# Patient Record
Sex: Male | Born: 1960 | Race: White | Hispanic: No | Marital: Married | Smoking: Never smoker
Health system: Southern US, Community
[De-identification: ages and names within clinical notes are randomized; demographics above are authoritative.]

## PROBLEM LIST (undated history)

## (undated) DIAGNOSIS — R319 Hematuria, unspecified: Secondary | ICD-10-CM

## (undated) DIAGNOSIS — E119 Type 2 diabetes mellitus without complications: Secondary | ICD-10-CM

## (undated) DIAGNOSIS — F39 Unspecified mood [affective] disorder: Secondary | ICD-10-CM

## (undated) HISTORY — DX: Hematuria, unspecified: R31.9

## (undated) HISTORY — PX: HERNIA REPAIR: SHX51

## (undated) HISTORY — DX: Unspecified mood (affective) disorder: F39

## (undated) HISTORY — PX: TONSILLECTOMY: SUR1361

## (undated) HISTORY — PX: FEMUR FRACTURE SURGERY: SHX633

---

## 1993-04-14 HISTORY — PX: LUMBAR DISC SURGERY: SHX700

## 2012-06-03 ENCOUNTER — Ambulatory Visit: Payer: Self-pay | Admitting: Emergency Medicine

## 2012-06-03 LAB — DOT URINE DIP
Blood: NEGATIVE
Specific Gravity: 1.02 (ref 1.003–1.030)

## 2012-10-29 LAB — LIPID PANEL
Cholesterol: 159 mg/dL (ref 0–200)
HDL: 31 mg/dL — AB (ref 35–70)
LDL Cholesterol: 106 mg/dL
TRIGLYCERIDES: 108 mg/dL (ref 40–160)

## 2012-10-29 LAB — BASIC METABOLIC PANEL
BUN: 13 mg/dL (ref 4–21)
CREATININE: 1.1 mg/dL (ref <=1.3)

## 2012-10-29 LAB — PSA: PSA: 0.9

## 2013-06-08 ENCOUNTER — Ambulatory Visit: Payer: Self-pay | Admitting: Family Medicine

## 2013-06-08 LAB — DOT URINE DIP
Glucose,UR: NEGATIVE mg/dL (ref 0–75)
Protein: NEGATIVE
Specific Gravity: 1.025 (ref 1.003–1.030)

## 2014-06-22 ENCOUNTER — Ambulatory Visit: Payer: Self-pay | Admitting: Emergency Medicine

## 2014-08-03 LAB — BASIC METABOLIC PANEL: GLUCOSE: 183 mg/dL

## 2014-10-30 ENCOUNTER — Encounter: Payer: Self-pay | Admitting: Internal Medicine

## 2014-10-30 ENCOUNTER — Other Ambulatory Visit: Payer: Self-pay | Admitting: Internal Medicine

## 2014-10-30 DIAGNOSIS — E785 Hyperlipidemia, unspecified: Secondary | ICD-10-CM | POA: Insufficient documentation

## 2014-10-30 DIAGNOSIS — I10 Essential (primary) hypertension: Secondary | ICD-10-CM | POA: Insufficient documentation

## 2014-10-30 DIAGNOSIS — E119 Type 2 diabetes mellitus without complications: Secondary | ICD-10-CM

## 2014-10-30 DIAGNOSIS — R7303 Prediabetes: Secondary | ICD-10-CM | POA: Insufficient documentation

## 2014-10-30 HISTORY — DX: Essential (primary) hypertension: I10

## 2014-12-08 ENCOUNTER — Ambulatory Visit: Payer: Self-pay | Admitting: Internal Medicine

## 2014-12-20 ENCOUNTER — Ambulatory Visit: Payer: Self-pay | Admitting: Internal Medicine

## 2014-12-26 ENCOUNTER — Encounter: Payer: Self-pay | Admitting: Internal Medicine

## 2014-12-26 ENCOUNTER — Ambulatory Visit (INDEPENDENT_AMBULATORY_CARE_PROVIDER_SITE_OTHER): Payer: Self-pay | Admitting: Internal Medicine

## 2014-12-26 VITALS — BP 100/60 | HR 84 | Ht 74.0 in | Wt 232.6 lb

## 2014-12-26 DIAGNOSIS — E785 Hyperlipidemia, unspecified: Secondary | ICD-10-CM

## 2014-12-26 DIAGNOSIS — I10 Essential (primary) hypertension: Secondary | ICD-10-CM

## 2014-12-26 DIAGNOSIS — E119 Type 2 diabetes mellitus without complications: Secondary | ICD-10-CM

## 2014-12-26 NOTE — Progress Notes (Signed)
Date:  12/26/2014   Name:  Daryl Cochran   DOB:  1960/09/10   MRN:  [id number]   Chief Complaint: Diabetes and Hypertension Diabetes He presents for his follow-up diabetic visit. He has type 2 diabetes mellitus. The initial diagnosis of diabetes was made 4 months ago. His disease course has been improving. There are no hypoglycemic associated symptoms. Pertinent negatives for diabetes include no chest pain, no polyphagia and no polyuria. Symptoms are stable. His weight is stable. He is following a diabetic and generally healthy diet. He participates in exercise daily. His breakfast blood glucose is taken between 6-7 am. His breakfast blood glucose range is generally 110-130 mg/dl.  Hypertension This is a chronic problem. The current episode started more than 1 year ago. The problem is unchanged. The problem is controlled. Pertinent negatives include no chest pain, palpitations or shortness of breath. The current treatment provides significant improvement. There are no compliance problems.      Review of Systems:  Review of Systems  Constitutional: Negative for fever and unexpected weight change (has lost about 15 pounds since his last visit).  Respiratory: Negative for cough, choking and shortness of breath.   Cardiovascular: Negative for chest pain and palpitations.  Gastrointestinal: Negative for abdominal pain and diarrhea.  Endocrine: Negative for polyphagia and polyuria.  Genitourinary: Negative for dysuria and frequency.    Patient Active Problem List   Diagnosis Date Noted  . Dyslipidemia 10/30/2014  . Essential (primary) hypertension 10/30/2014  . Diabetes mellitus, type 2 10/30/2014    Prior to Admission medications   Medication Sig Start Date End Date Taking? Authorizing Provider  lisinopril (PRINIVIL,ZESTRIL) 10 MG tablet Take 1 tablet by mouth daily. 05/30/14  Yes Historical Provider, MD  metFORMIN (GLUMETZA) 500 MG (MOD) 24 hr tablet Take 2 tablets by mouth daily.  08/03/14  Yes Historical Provider, MD    No Known Allergies  Past Surgical History  Procedure Laterality Date  . Tonsillectomy    . Lumbar disc surgery  1995    Social History  Substance Use Topics  . Smoking status: Never Smoker   . Smokeless tobacco: None  . Alcohol Use: 2.4 oz/week    4 Standard drinks or equivalent per week     Medication list has been reviewed and updated.  Physical Examination:  Physical Exam  Constitutional: He is oriented to person, place, and time. He appears well-developed and well-nourished.  Neck: Normal range of motion. Neck supple. No thyromegaly present.  Cardiovascular: Normal rate, regular rhythm and normal heart sounds.   Pulmonary/Chest: Effort normal and breath sounds normal. No respiratory distress.  Musculoskeletal: He exhibits no edema.  Neurological: He is alert and oriented to person, place, and time.  Psychiatric: He has a normal mood and affect.  Nursing note and vitals reviewed.   BP 100/60 mmHg  Pulse 84  Ht 6\' 2"  (1.88 m)  Wt 232 lb 9.6 oz (105.507 kg)  BMI 29.85 kg/m2  Assessment and Plan: 1. Essential (primary) hypertension Control continue ACE inhibitor - Basic metabolic panel  2. Type 2 diabetes mellitus without complication [place]'s diet and weight loss; continue metformin - Hemoglobin A1c  3. Dyslipidemia Last lipids were within normal range Continue low-fat diet and will check at follow-up visit   Halina Maidens, MD [facility]  12/26/2014

## 2014-12-27 LAB — BASIC METABOLIC PANEL
BUN/Creatinine Ratio: 22 — ABNORMAL HIGH (ref 9–20)
BUN: 26 mg/dL — AB (ref 6–24)
CALCIUM: 9.8 mg/dL (ref 8.7–10.2)
CO2: 23 mmol/L (ref 18–29)
Chloride: 99 mmol/L (ref 97–108)
Creatinine, Ser: 1.19 mg/dL (ref 0.76–1.27)
GFR calc Af Amer: 80 mL/min/{1.73_m2} (ref >59)
GFR calc non Af Amer: 69 mL/min/{1.73_m2} (ref >59)
GLUCOSE: 124 mg/dL — AB (ref 65–99)
Potassium: 4.8 mmol/L (ref 3.5–5.2)
Sodium: 139 mmol/L (ref 134–144)

## 2014-12-27 LAB — HEMOGLOBIN A1C
ESTIMATED AVERAGE GLUCOSE: 134 mg/dL
Hgb A1c MFr Bld: 6.3 % — ABNORMAL HIGH (ref 4.8–5.6)

## 2015-01-18 ENCOUNTER — Other Ambulatory Visit: Payer: Self-pay | Admitting: Internal Medicine

## 2015-04-27 ENCOUNTER — Other Ambulatory Visit: Payer: Self-pay | Admitting: Internal Medicine

## 2015-04-27 ENCOUNTER — Ambulatory Visit: Payer: Self-pay | Admitting: Internal Medicine

## 2015-07-03 ENCOUNTER — Other Ambulatory Visit: Payer: Self-pay | Admitting: Internal Medicine

## 2015-07-12 NOTE — Telephone Encounter (Signed)
Number is not in service. Unable to reach pt

## 2015-08-14 ENCOUNTER — Encounter: Payer: Self-pay | Admitting: Internal Medicine

## 2015-08-14 ENCOUNTER — Ambulatory Visit (INDEPENDENT_AMBULATORY_CARE_PROVIDER_SITE_OTHER): Payer: 59 | Admitting: Internal Medicine

## 2015-08-14 VITALS — BP 122/74 | HR 77 | Temp 98.2°F | Resp 16 | Ht 74.0 in | Wt 238.0 lb

## 2015-08-14 DIAGNOSIS — E119 Type 2 diabetes mellitus without complications: Secondary | ICD-10-CM

## 2015-08-14 DIAGNOSIS — S86812A Strain of other muscle(s) and tendon(s) at lower leg level, left leg, initial encounter: Secondary | ICD-10-CM | POA: Diagnosis not present

## 2015-08-14 DIAGNOSIS — I10 Essential (primary) hypertension: Secondary | ICD-10-CM

## 2015-08-14 MED ORDER — LISINOPRIL 10 MG PO TABS: 10.0000 mg | ORAL_TABLET | Freq: Every day | ORAL | Status: DC

## 2015-08-14 MED ORDER — METFORMIN HCL ER 500 MG PO TB24: ORAL_TABLET | ORAL | Status: DC

## 2015-08-14 NOTE — Progress Notes (Signed)
Date:  08/14/2015   Name:  Daryl Cochran   DOB:  October 08, 1960   MRN:  [id number]   Chief Complaint: Hypertension; Leg Pain; and Diabetes Hypertension This is a chronic problem. The current episode started more than 1 month ago. The problem is unchanged. The problem is controlled. Pertinent negatives include no chest pain, headaches, palpitations or shortness of breath.  Leg Pain  The incident occurred 6 to 12 hours ago. The incident occurred at work. The injury mechanism is unknown. The pain is present in the left leg (calf pain with lifting). Pertinent negatives include no numbness.  Diabetes He presents for his follow-up diabetic visit. He has type 2 diabetes mellitus. His disease course has been stable. Pertinent negatives for hypoglycemia include no headaches or tremors. Pertinent negatives for diabetes include no chest pain, no fatigue, no polydipsia and no polyuria. His weight is stable. He monitors blood glucose at home 1-2 x per week. His breakfast blood glucose is taken between 6-7 am. His breakfast blood glucose range is generally 130-140 mg/dl.     Lab Results  Component Value Date   HGBA1C 6.3* 12/26/2014    Review of Systems  Constitutional: Negative for fever, chills, appetite change, fatigue and unexpected weight change.  Eyes: Negative for visual disturbance.  Respiratory: Negative for cough, chest tightness, shortness of breath and wheezing.   Cardiovascular: Negative for chest pain, palpitations and leg swelling.  Gastrointestinal: Negative for abdominal pain and blood in stool.  Endocrine: Negative for polydipsia and polyuria.  Genitourinary: Negative for dysuria, hematuria and difficulty urinating.  Musculoskeletal: Positive for myalgias.  Skin: Negative for color change and rash.  Neurological: Negative for tremors, numbness and headaches.  Psychiatric/Behavioral: Negative for dysphoric mood.    Patient Active Problem List   Diagnosis Date Noted  .  Dyslipidemia 10/30/2014  . Essential (primary) hypertension 10/30/2014  . Diabetes mellitus, type 2 ([place]) 10/30/2014    Prior to Admission medications   Medication Sig Start Date End Date Taking? Authorizing Provider  lisinopril (PRINIVIL,ZESTRIL) 10 MG tablet TAKE ONE (1) TABLET BY MOUTH ONCE DAILY 07/03/15  Yes Glean Hess, MD  metFORMIN (GLUCOPHAGE-XR) 500 MG 24 hr tablet TAKE (2) TABLETS BY MOUTH EVERY DAY W[facility] MEAL 01/18/15  Yes Glean Hess, MD    No Known Allergies  Past Surgical History  Procedure Laterality Date  . Tonsillectomy    . Lumbar disc surgery  1995    Social History  Substance Use Topics  . Smoking status: Never Smoker   . Smokeless tobacco: None  . Alcohol Use: 2.4 oz/week    4 Standard drinks or equivalent per week    Medication list has been reviewed and updated.   Physical Exam  Constitutional: He is oriented to person, place, and time. He appears well-developed and well-nourished.  Neck: Normal range of motion. Neck supple. Carotid bruit is not present. No thyromegaly present.  Cardiovascular: Normal rate, regular rhythm and normal heart sounds.   Pulmonary/Chest: Breath sounds normal.  Musculoskeletal:       Left lower leg: He exhibits tenderness. He exhibits no swelling, no edema and no laceration.  Neurological: He is alert and oriented to person, place, and time.  Reflex Scores:      Patellar reflexes are 2+ on the right side and 2+ on the left side. Psychiatric: He has a normal mood and affect.  Nursing note and vitals reviewed.   BP 142/82 mmHg  Pulse 77  Temp(Src) 98.2 F (36.8  C) (Oral)  Resp 16  Ht 6\' 2"  (1.88 m)  Wt 238 lb (107.956 kg)  BMI 30.54 kg/m2  SpO2 98%  Assessment and Plan: 1. Essential (primary) hypertension controlled - lisinopril (PRINIVIL,ZESTRIL) 10 MG tablet; Take 1 tablet (10 mg total) by mouth daily.  Dispense: 30 tablet; Refill: 0  2. Type 2 diabetes mellitus without complication, without  long-term current use of insulin (HCC) Continue current therapy Labs next visit at [facility] - metFORMIN (GLUCOPHAGE-XR) 500 MG 24 hr tablet; TAKE (2) TABLETS BY MOUTH EVERY DAY WITHEVENING MEAL  Dispense: 60 tablet; Refill: 12  3. Strain of calf muscle, left, initial encounter Conservative care with modified activities   Halina Maidens, MD [facility]  08/14/2015

## 2015-09-17 ENCOUNTER — Encounter: Payer: Self-pay | Admitting: Internal Medicine

## 2015-09-17 ENCOUNTER — Ambulatory Visit (INDEPENDENT_AMBULATORY_CARE_PROVIDER_SITE_OTHER): Payer: 59 | Admitting: Internal Medicine

## 2015-09-17 ENCOUNTER — Other Ambulatory Visit: Payer: Self-pay | Admitting: Internal Medicine

## 2015-09-17 VITALS — BP 115/78 | HR 62 | Resp 16 | Wt 234.0 lb

## 2015-09-17 DIAGNOSIS — E119 Type 2 diabetes mellitus without complications: Secondary | ICD-10-CM

## 2015-09-17 DIAGNOSIS — I10 Essential (primary) hypertension: Secondary | ICD-10-CM

## 2015-09-17 DIAGNOSIS — Z1159 Encounter for screening for other viral diseases: Secondary | ICD-10-CM

## 2015-09-17 DIAGNOSIS — Z1211 Encounter for screening for malignant neoplasm of colon: Secondary | ICD-10-CM

## 2015-09-17 DIAGNOSIS — E785 Hyperlipidemia, unspecified: Secondary | ICD-10-CM

## 2015-09-17 DIAGNOSIS — Z125 Encounter for screening for malignant neoplasm of prostate: Secondary | ICD-10-CM | POA: Diagnosis not present

## 2015-09-17 DIAGNOSIS — Z0000 Encounter for general adult medical examination without abnormal findings: Secondary | ICD-10-CM

## 2015-09-17 LAB — POCT URINALYSIS DIPSTICK
BILIRUBIN UA: NEGATIVE
Blood, UA: NEGATIVE
Glucose, UA: NEGATIVE
Ketones, UA: NEGATIVE
LEUKOCYTES UA: NEGATIVE
NITRITE UA: NEGATIVE
PH UA: 6
Protein, UA: NEGATIVE
Spec Grav, UA: 1.015
Urobilinogen, UA: 0.2

## 2015-09-17 MED ORDER — ATORVASTATIN CALCIUM 10 MG PO TABS: 10.0000 mg | ORAL_TABLET | Freq: Every day | ORAL | Status: DC

## 2015-09-17 NOTE — Progress Notes (Signed)
Date:  09/17/2015   Name:  Daryl Cochran   DOB:  [birth year]   MRN:  [id number]   Chief Complaint: Annual Exam; Hypertension; and Diabetes Jackman Carraher is a 55 y.o. male who presents today for his Complete Annual Exam. He feels well. He reports exercising some. He reports he is sleeping well. He is due for colonoscopy - low risk so would prefer Cologuard.  He has never taken cholesterol medication but needs to start one.  Hypertension This is a chronic problem. The current episode started more than 1 year ago. The problem is unchanged. The problem is controlled. Pertinent negatives include no chest pain, headaches, palpitations or shortness of breath. Risk factors for coronary artery disease include diabetes mellitus and dyslipidemia. Past treatments include ACE inhibitors. The current treatment provides significant improvement.  Diabetes He presents for his follow-up diabetic visit. He has type 2 diabetes mellitus. His disease course has been stable. Pertinent negatives for hypoglycemia include no dizziness or headaches. Pertinent negatives for diabetes include no chest pain and no fatigue. Current diabetic treatment includes oral agent (monotherapy). He is compliant with treatment most of the time. An ACE inhibitor/angiotensin II receptor blocker is being taken. Eye exam is not current.  Hyperlipidemia This is a chronic problem. The current episode started more than 1 year ago. Recent lipid tests were reviewed and are high. Exacerbating diseases include diabetes. Pertinent negatives include no chest pain, myalgias or shortness of breath.     Lab Results  Component Value Date   HGBA1C 6.3* 12/26/2014    Review of Systems  Constitutional: Negative for chills, diaphoresis, appetite change, fatigue and unexpected weight change.  HENT: Negative for hearing loss, tinnitus, trouble swallowing and voice change.   Eyes: Negative for visual disturbance.  Respiratory: Negative for  choking, chest tightness, shortness of breath and wheezing.   Cardiovascular: Negative for chest pain, palpitations and leg swelling.  Gastrointestinal: Negative for abdominal pain, diarrhea, constipation and blood in stool.  Genitourinary: Negative for dysuria, frequency and difficulty urinating.  Musculoskeletal: Negative for myalgias, back pain and arthralgias.  Skin: Negative for color change and rash.  Neurological: Negative for dizziness, syncope and headaches.  Hematological: Negative for adenopathy.  Psychiatric/Behavioral: Negative for sleep disturbance and dysphoric mood.    Patient Active Problem List   Diagnosis Date Noted  . Dyslipidemia 10/30/2014  . Essential (primary) hypertension 10/30/2014  . Diabetes mellitus, type 2 ([place]) 10/30/2014    Prior to Admission medications   Medication Sig Start Date End Date Taking? Authorizing Provider  lisinopril (PRINIVIL,ZESTRIL) 10 MG tablet Take 1 tablet (10 mg total) by mouth daily. 08/14/15  Yes Glean Hess, MD  metFORMIN (GLUCOPHAGE-XR) 500 MG 24 hr tablet TAKE (2) TABLETS BY MOUTH EVERY DAY WITHEVENING MEAL 08/14/15  Yes Glean Hess, MD    No Known Allergies  Past Surgical History  Procedure Laterality Date  . Tonsillectomy    . Lumbar disc surgery  1995    Social History  Substance Use Topics  . Smoking status: Never Smoker   . Smokeless tobacco: None  . Alcohol Use: 2.4 oz/week    4 Standard drinks or equivalent per week    Medication list has been reviewed and updated.   Physical Exam  Constitutional: He is oriented to person, place, and time. He appears well-developed and well-nourished.  HENT:  Head: Normocephalic.  Right Ear: Tympanic membrane, external ear and ear canal normal.  Left Ear: Tympanic membrane, external ear and ear  canal normal.  Nose: Nose normal.  Mouth/Throat: Uvula is midline and oropharynx is clear and moist.  Eyes: Conjunctivae and EOM are normal. Pupils are equal, round, and  reactive to light.  Neck: Normal range of motion. Neck supple. Carotid bruit is not present. No thyromegaly present.  Cardiovascular: Normal rate, regular rhythm, normal heart sounds and intact distal pulses.   Pulmonary/Chest: Effort normal and breath sounds normal. He has no wheezes. Right breast exhibits no mass. Left breast exhibits no mass.  Abdominal: Soft. Normal appearance and bowel sounds are normal. There is no hepatosplenomegaly. There is no tenderness.  Musculoskeletal: Normal range of motion. He exhibits no edema or tenderness.  Lymphadenopathy:    He has no cervical adenopathy.  Neurological: He is alert and oriented to person, place, and time. He has normal reflexes. No cranial nerve deficit. Coordination normal.  Foot exam - normal skin, nails, pulses and sensation  Skin: Skin is warm, dry and intact.  Multiple skin tags, capillary hemangiomas, AKs and flat nevi over chest, neck and back.  Psychiatric: He has a normal mood and affect. His speech is normal and behavior is normal. Judgment and thought content normal.  Nursing note and vitals reviewed.   BP 115/78 mmHg  Pulse 62  Resp 16  Wt 234 lb (106.142 kg)  SpO2 98%  Assessment and Plan: 1. Annual physical exam Normal exam Recommend beginning regular exercise for weight control Recommend Dermatology skin survey  2. Essential (primary) hypertension - CBC with Differential/Platelet - TSH - POCT urinalysis dipstick  3. Type 2 diabetes mellitus without complication, without long-term current use of insulin (HCC) controlled - Comprehensive metabolic panel - Hemoglobin A1c - Microalbumin / creatinine urine ratio  4. Dyslipidemia Begin atorvastatin - Lipid panel - atorvastatin (LIPITOR) 10 MG tablet; Take 1 tablet (10 mg total) by mouth daily.  Dispense: 30 tablet; Refill: 5  5. Prostate cancer screening DRE deferred to lack of symptoms - PSA  6. Need for hepatitis C screening test - Hepatitis C  antibody  7. Colon cancer screening - Cologuard   Halina Maidens, MD [facility]  09/17/2015

## 2015-09-17 NOTE — Patient Instructions (Addendum)
Dermatology Evaluation  Diabetic Retinopathy Diabetic retinopathy is a disease of the light-sensitive membrane at the back of the eye (retina). It is a complication of diabetes and a common cause of blindness. Early detection of the disease is key to keeping your eyes healthy.  CAUSES  Diabetic retinopathy is caused by blood sugar (glucose) levels that are too high over an extended period of time. High blood sugars cause damage to the small blood vessels of the retina, allowing blood to leak through the vessel walls. This causes visual impairment and eventually blindness. RISK FACTORS  High blood pressure.  Having diabetes for a long time.  Having poorly controlled blood sugars. SIGNS AND SYMPTOMS  In the early stages of diabetic retinopathy, there are often no symptoms. As the condition advances, symptoms may include:  Blurred vision. This is usually caused by a swelling due to abnormal blood glucose levels. The blurriness may go away when blood glucose levels return to normal.  Moving specks or dark spots (floaters) in your vision. These can be caused by a small retinal hemorrhage. A hemorrhage is bleeding from blood vessels.  Missing parts of your field of vision, such as things at the side. This can be caused by larger retinal hemorrhages.  Difficulty reading books or signs.  Double vision.  Pain in one or both eyes.  Feeling pressure in one or both eyes.  Trouble seeing straight lines. Straight lines do not look straight.  Redness of the eyes that does not go away. DIAGNOSIS  Your eye care specialist can detect changes in the blood vessels of your eye by putting drops in your eyes that enlarge (dilate) your pupils. This allows your eye care specialist to get a good look at your retina to see if there are any changes that have occurred as a result of your diabetes. You should have your eyes examined once a year. TREATMENT  Your eye care specialist may use a special laser beam  to seal the blood vessels of the retina and stop them from leaking. Early detection and treatment are important so that further damage to your eyes can be prevented. In addition, managing your blood sugars and keeping them in the target range can slow the progress of the disease. HOME CARE INSTRUCTIONS   Keep your blood pressure within your target range.  Keep your blood glucose levels within your target range.  Follow your health care provider's instructions regarding diet and other means for controlling your blood glucose levels.  Check your blood levels for glucose as recommended by your health care provider.  Keep regular appointments with your eye specialist. An eye specialist can usually see diabetic retinopathy developing long before it starts causing problems. In many cases, it can be treated to prevent complications from occurring. If you have diabetes, you should have your eyes checked at least every year. Your risk of retinopathy increases the longer you have the disease.  If you smoke, quit. Ask your health care provider for help if needed. Smoking can make retinopathy worse. SEEK MEDICAL CARE IF:   You notice gradual blurring or other changes in your vision over time.  You notice that your glasses or contact lenses do not make things look as sharp as they once did.  You have trouble reading or seeing details at a distance with either eye.  You notice a sudden change in your vision or notice that parts of your field of vision appear missing or hazy.  You suddenly see moving specks  or dark spots in the field of vision of either eye.  You have sudden partial loss of vision in either eye.   This information is not intended to replace advice given to you by your health care provider. Make sure you discuss any questions you have with your health care provider.   Document Released: 03/28/2000 Document Revised: 01/19/2013 Document Reviewed: 09/20/2012 Elsevier Interactive Patient  Education [employer].

## 2015-09-18 LAB — COMPREHENSIVE METABOLIC PANEL
ALBUMIN: 4.7 g/dL (ref 3.5–5.5)
ALT: 20 IU/L (ref 0–44)
AST: 11 IU/L (ref 0–40)
Albumin/Globulin Ratio: 2 (ref 1.2–2.2)
Alkaline Phosphatase: 66 IU/L (ref 39–117)
BILIRUBIN TOTAL: 0.3 mg/dL (ref 0.0–1.2)
BUN / CREAT RATIO: 15 (ref 9–20)
BUN: 15 mg/dL (ref 6–24)
CALCIUM: 9.7 mg/dL (ref 8.7–10.2)
CHLORIDE: 100 mmol/L (ref 96–106)
CO2: 21 mmol/L (ref 18–29)
Creatinine, Ser: 1 mg/dL (ref 0.76–1.27)
GFR, EST AFRICAN AMERICAN: 98 mL/min/{1.73_m2} (ref >59)
GFR, EST NON AFRICAN AMERICAN: 85 mL/min/{1.73_m2} (ref >59)
GLUCOSE: 98 mg/dL (ref 65–99)
Globulin, Total: 2.4 g/dL (ref 1.5–4.5)
Potassium: 4.6 mmol/L (ref 3.5–5.2)
Sodium: 143 mmol/L (ref 134–144)
TOTAL PROTEIN: 7.1 g/dL (ref 6.0–8.5)

## 2015-09-18 LAB — CBC WITH DIFFERENTIAL/PLATELET
BASOS: 1 %
Basophils Absolute: 0 10*3/uL (ref 0.0–0.2)
EOS (ABSOLUTE): 0.2 10*3/uL (ref 0.0–0.4)
Eos: 2 %
HEMOGLOBIN: 15.5 g/dL (ref 12.6–17.7)
Hematocrit: 46 % (ref 37.5–51.0)
IMMATURE GRANS (ABS): 0.1 10*3/uL (ref 0.0–0.1)
IMMATURE GRANULOCYTES: 1 %
LYMPHS: 37 %
Lymphocytes Absolute: 3.1 10*3/uL (ref 0.7–3.1)
MCH: 28.5 pg (ref 26.6–33.0)
MCHC: 33.7 g/dL (ref 31.5–35.7)
MCV: 85 fL (ref 79–97)
MONOCYTES: 9 %
Monocytes Absolute: 0.8 10*3/uL (ref 0.1–0.9)
NEUTROS ABS: 4.2 10*3/uL (ref 1.4–7.0)
Neutrophils: 50 %
Platelets: 275 10*3/uL (ref 150–379)
RBC: 5.44 x10E6/uL (ref 4.14–5.80)
RDW: 14.4 % (ref 12.3–15.4)
WBC: 8.4 10*3/uL (ref 3.4–10.8)

## 2015-09-18 LAB — LIPID PANEL
CHOLESTEROL TOTAL: 192 mg/dL (ref 100–199)
Chol/HDL Ratio: 6.9 ratio units — ABNORMAL HIGH (ref 0.0–5.0)
HDL: 28 mg/dL — AB (ref >39)
LDL CALC: 126 mg/dL — AB (ref 0–99)
TRIGLYCERIDES: 188 mg/dL — AB (ref 0–149)
VLDL CHOLESTEROL CAL: 38 mg/dL (ref 5–40)

## 2015-09-18 LAB — TSH: TSH: 3.76 u[IU]/mL (ref 0.450–4.500)

## 2015-09-18 LAB — PSA: PROSTATE SPECIFIC AG, SERUM: 1.1 ng/mL (ref 0.0–4.0)

## 2015-09-18 LAB — HEPATITIS C ANTIBODY

## 2015-09-18 LAB — HEMOGLOBIN A1C
Est. average glucose Bld gHb Est-mCnc: 143 mg/dL
Hgb A1c MFr Bld: 6.6 % — ABNORMAL HIGH (ref 4.8–5.6)

## 2015-09-19 LAB — MICROALBUMIN / CREATININE URINE RATIO
CREATININE, UR: 121.9 mg/dL
MICROALB/CREAT RATIO: 4.4 mg/g{creat} (ref 0.0–30.0)
MICROALBUM., U, RANDOM: 5.4 ug/mL

## 2015-09-19 LAB — PLEASE NOTE

## 2015-11-16 LAB — COLOGUARD

## 2015-11-28 ENCOUNTER — Other Ambulatory Visit: Payer: Self-pay | Admitting: Internal Medicine

## 2015-11-28 ENCOUNTER — Telehealth: Payer: Self-pay

## 2015-11-28 DIAGNOSIS — Z1211 Encounter for screening for malignant neoplasm of colon: Secondary | ICD-10-CM

## 2015-11-28 NOTE — Progress Notes (Signed)
Patient informed by Theresia Majors. GI referral will be placed.

## 2015-11-28 NOTE — Telephone Encounter (Signed)
     STAFF MESSAGE FROM [facility]  Inform patient that cologuard was positive, He needs to be referred for a Colonoscopy.  If he has an MD preference, let me know.    Taesha Goodell Responded:  Informed of results. Patient does not have any preference of who sees for this. Please go ahead and order referral.

## 2015-12-05 ENCOUNTER — Other Ambulatory Visit: Payer: Self-pay

## 2015-12-06 ENCOUNTER — Telehealth: Payer: Self-pay

## 2015-12-06 NOTE — Telephone Encounter (Signed)
Patient says his Metformin comes out in still not digested. Should he be concerned?

## 2015-12-06 NOTE — Telephone Encounter (Signed)
The metformin is formulated so that the outer shell does not digest.  The medication is time released through a hole in the end.

## 2015-12-07 ENCOUNTER — Telehealth: Payer: Self-pay

## 2015-12-07 ENCOUNTER — Other Ambulatory Visit: Payer: Self-pay

## 2015-12-07 NOTE — Telephone Encounter (Signed)
Advised to monitor sugars and if blood in stool call back but this is just process when the shell is made no not digest.

## 2015-12-07 NOTE — Telephone Encounter (Signed)
Screening Colonoscopy Z12.11 [facility] [id number]  Please pre cert

## 2015-12-07 NOTE — Telephone Encounter (Signed)
Gastroenterology Pre-Procedure Review  Request Date: 01/03/2016 Requesting Physician: Dr. Army Melia  PATIENT REVIEW QUESTIONS: The patient responded to the following health history questions as indicated:    1. Are you having any GI issues? yes (Diarrhea/ loose stools) 2. Do you have a personal history of Polyps? no 3. Do you have a family history of Colon Cancer or Polyps? yes (sister, benign ) 4. Diabetes Mellitus? yes (Type 2) 5. Joint replacements in the past 12 months?no 6. Major health problems in the past 3 months?no 7. Any artificial heart valves, MVP, or defibrillator?no    MEDICATIONS & ALLERGIES:    Patient reports the following regarding taking any anticoagulation/antiplatelet therapy:   Plavix, Coumadin, Eliquis, Xarelto, Lovenox, Pradaxa, Brilinta, or Effient? no Aspirin? yes (Joint pain, rarely takes)  Patient confirms/reports the following medications:  Current Outpatient Prescriptions  Medication Sig Dispense Refill  . atorvastatin (LIPITOR) 10 MG tablet Take 1 tablet (10 mg total) by mouth daily. 30 tablet 5  . lisinopril (PRINIVIL,ZESTRIL) 10 MG tablet Take 1 tablet (10 mg total) by mouth daily. 30 tablet 0  . metFORMIN (GLUCOPHAGE-XR) 500 MG 24 hr tablet TAKE (2) TABLETS BY MOUTH EVERY DAY WITHEVENING MEAL 60 tablet 12   No current facility-administered medications for this visit.     Patient confirms/reports the following allergies:  No Known Allergies  No orders of the defined types were placed in this encounter.   AUTHORIZATION INFORMATION Primary Insurance: 1D#: Group #:  Secondary Insurance: 1D#: Group #:  SCHEDULE INFORMATION: Date: 01/03/2016 Time: Location: [facility]

## 2015-12-08 ENCOUNTER — Other Ambulatory Visit: Payer: Self-pay | Admitting: Internal Medicine

## 2015-12-28 ENCOUNTER — Encounter: Payer: Self-pay | Admitting: *Deleted

## 2016-01-01 NOTE — Discharge Instructions (Signed)

## 2016-01-03 ENCOUNTER — Ambulatory Visit: Payer: Commercial Managed Care - HMO | Admitting: Student in an Organized Health Care Education/Training Program

## 2016-01-03 ENCOUNTER — Ambulatory Visit
Admission: RE | Admit: 2016-01-03 | Discharge: 2016-01-03 | Disposition: A | Discharge status: Home / Self Care | Payer: Commercial Managed Care - HMO | Source: Ambulatory Visit | Attending: Gastroenterology | Admitting: Gastroenterology

## 2016-01-03 ENCOUNTER — Encounter: Payer: Self-pay | Admitting: Anesthesiology

## 2016-01-03 ENCOUNTER — Encounter
Admission: RE | Disposition: A | Discharge status: Home / Self Care | Payer: Self-pay | Source: Ambulatory Visit | Attending: Gastroenterology

## 2016-01-03 DIAGNOSIS — K621 Rectal polyp: Secondary | ICD-10-CM | POA: Diagnosis not present

## 2016-01-03 DIAGNOSIS — R195 Other fecal abnormalities: Secondary | ICD-10-CM | POA: Diagnosis not present

## 2016-01-03 DIAGNOSIS — Z7984 Long term (current) use of oral hypoglycemic drugs: Secondary | ICD-10-CM | POA: Insufficient documentation

## 2016-01-03 DIAGNOSIS — Z79899 Other long term (current) drug therapy: Secondary | ICD-10-CM | POA: Insufficient documentation

## 2016-01-03 DIAGNOSIS — K641 Second degree hemorrhoids: Secondary | ICD-10-CM | POA: Insufficient documentation

## 2016-01-03 DIAGNOSIS — E119 Type 2 diabetes mellitus without complications: Secondary | ICD-10-CM | POA: Insufficient documentation

## 2016-01-03 DIAGNOSIS — Z8249 Family history of ischemic heart disease and other diseases of the circulatory system: Secondary | ICD-10-CM | POA: Insufficient documentation

## 2016-01-03 DIAGNOSIS — D122 Benign neoplasm of ascending colon: Secondary | ICD-10-CM | POA: Diagnosis not present

## 2016-01-03 DIAGNOSIS — D126 Benign neoplasm of colon, unspecified: Secondary | ICD-10-CM

## 2016-01-03 DIAGNOSIS — Z9889 Other specified postprocedural states: Secondary | ICD-10-CM | POA: Insufficient documentation

## 2016-01-03 DIAGNOSIS — Z86010 Personal history of colonic polyps: Secondary | ICD-10-CM

## 2016-01-03 DIAGNOSIS — Z833 Family history of diabetes mellitus: Secondary | ICD-10-CM | POA: Insufficient documentation

## 2016-01-03 HISTORY — PX: POLYPECTOMY: SHX5525

## 2016-01-03 HISTORY — DX: Type 2 diabetes mellitus without complications: E11.9

## 2016-01-03 HISTORY — PX: COLONOSCOPY WITH PROPOFOL: SHX5780

## 2016-01-03 LAB — GLUCOSE, CAPILLARY
Glucose-Capillary: 104 mg/dL — ABNORMAL HIGH (ref 65–99)
Glucose-Capillary: 93 mg/dL (ref 65–99)

## 2016-01-03 SURGERY — COLONOSCOPY WITH PROPOFOL
Anesthesia: Monitor Anesthesia Care | Wound class: Contaminated

## 2016-01-03 MED ORDER — LIDOCAINE HCL (CARDIAC) 20 MG/ML IV SOLN
INTRAVENOUS | Status: DC | PRN
  Administered 2016-01-03: 50 mg via INTRAVENOUS

## 2016-01-03 MED ORDER — OXYCODONE HCL 5 MG/5ML PO SOLN: 5.0000 mg | Freq: Once | ORAL | Status: DC | PRN

## 2016-01-03 MED ORDER — PROPOFOL 10 MG/ML IV BOLUS
INTRAVENOUS | Status: DC | PRN
  Administered 2016-01-03: 20 mg via INTRAVENOUS
  Administered 2016-01-03: 10 mg via INTRAVENOUS
  Administered 2016-01-03: 20 mg via INTRAVENOUS
  Administered 2016-01-03: 30 mg via INTRAVENOUS
  Administered 2016-01-03: 50 mg via INTRAVENOUS
  Administered 2016-01-03: 40 mg via INTRAVENOUS

## 2016-01-03 MED ORDER — LACTATED RINGERS IV SOLN
INTRAVENOUS | Status: DC
  Administered 2016-01-03: 08:00:00 via INTRAVENOUS

## 2016-01-03 MED ORDER — STERILE WATER FOR IRRIGATION IR SOLN
Status: DC | PRN
  Administered 2016-01-03: 08:00:00

## 2016-01-03 MED ORDER — OXYCODONE HCL 5 MG PO TABS: 5.0000 mg | ORAL_TABLET | Freq: Once | ORAL | Status: DC | PRN

## 2016-01-03 MED ORDER — SODIUM CHLORIDE 0.9 % IV SOLN: INTRAVENOUS | Status: DC

## 2016-01-03 SURGICAL SUPPLY — 23 items

## 2016-01-03 NOTE — Anesthesia Procedure Notes (Signed)
Procedure Name: MAC Date/Time: 01/03/2016 7:57 AM Performed by: Janna Arch Pre-anesthesia Checklist: Patient identified, Emergency Drugs available, Suction available, Patient being monitored and Timeout performed Patient Re-evaluated:Patient Re-evaluated prior to inductionOxygen Delivery Method: Nasal cannula

## 2016-01-03 NOTE — Anesthesia Preprocedure Evaluation (Signed)
Anesthesia Evaluation  Patient identified by MRN, date of birth, ID band  Reviewed: NPO status   History of Anesthesia Complications Negative for: history of anesthetic complications  Airway Mallampati: II  TM Distance: >3 FB Neck ROM: full    Dental no notable dental hx.    Pulmonary neg pulmonary ROS,    Pulmonary exam normal        Cardiovascular Exercise Tolerance: Good hypertension, Normal cardiovascular exam     Neuro/Psych negative neurological ROS  negative psych ROS   GI/Hepatic negative GI ROS, Neg liver ROS,   Endo/Other  diabetes  Renal/GU negative Renal ROS  negative genitourinary   Musculoskeletal   Abdominal   Peds  Hematology negative hematology ROS (+)   Anesthesia Other Findings   Reproductive/Obstetrics                             Anesthesia Physical Anesthesia Plan  ASA: II  Anesthesia Plan: MAC   Post-op Pain Management:    Induction:   Airway Management Planned:   Additional Equipment:   Intra-op Plan:   Post-operative Plan:   Informed Consent: I have reviewed the patients History and Physical, chart, labs and discussed the procedure including the risks, benefits and alternatives for the proposed anesthesia with the patient or authorized representative who has indicated his/her understanding and acceptance.     Plan Discussed with: CRNA  Anesthesia Plan Comments:         Anesthesia Quick Evaluation

## 2016-01-03 NOTE — H&P (Signed)
  [name], MD [facility] [address] [place], [place] [address] Phone: [phone] Fax : [phone]  Primary Care Physician:  Halina Maidens, MD Primary Gastroenterologist:  Dr. Allen Norris  Pre-Procedure History & Physical: HPI:  Daryl Cochran is a 55 y.o. male is here for an colonoscopy.   Past Medical History:  Diagnosis Date  . Diabetes mellitus without complication [facility])     Past Surgical History:  Procedure Laterality Date  . FEMUR FRACTURE SURGERY Left    rod in leg "from hip to knee"  . HERNIA REPAIR     as child  . [place] SURGERY  1995  . TONSILLECTOMY      Prior to Admission medications   Medication Sig Start Date End Date Taking? Authorizing Provider  atorvastatin (LIPITOR) 10 MG tablet Take 1 tablet (10 mg total) by mouth daily. 09/17/15  Yes Glean Hess, MD  lisinopril (PRINIVIL,ZESTRIL) 10 MG tablet Take 1 tablet (10 mg total) by mouth daily. 08/14/15  Yes Glean Hess, MD  lisinopril (PRINIVIL,ZESTRIL) 10 MG tablet TAKE (1) TABLET BY MOUTH EVERY DAY 12/08/15  Yes Glean Hess, MD  metFORMIN (GLUCOPHAGE-XR) 500 MG 24 hr tablet TAKE (2) TABLETS BY MOUTH EVERY DAY WITHEVENING MEAL 08/14/15  Yes Glean Hess, MD    Allergies as of 12/07/2015  . (No Known Allergies)    Family History  Problem Relation Age of Onset  . Diabetes Father   . Heart failure Father     deceased MI age 54    Social History   Social History  . Marital status: Married    Spouse name: N/A  . Number of children: N/A  . Years of education: N/A   Occupational History  . Not on file.   Social History Main Topics  . Smoking status: Never Smoker  . Smokeless tobacco: Never Used  . Alcohol use 2.4 oz/week    4 Standard drinks or equivalent per week  . Drug use: Unknown  . Sexual activity: Not on file   Other Topics Concern  . Not on file   Social History Narrative  . No narrative on file    Review of Systems: See HPI, otherwise negative  ROS  Physical Exam: BP (!) 151/81   Pulse 74   Temp (!) 96.8 F (36 C) (Tympanic)   Resp 16   Ht 6\' 2"  (1.88 m)   Wt 217 lb (98.4 kg)   SpO2 98%   BMI 27.86 kg/m  General:   Alert,  pleasant and cooperative in NAD Head:  Normocephalic and atraumatic. Neck:  Supple; no masses or thyromegaly. Lungs:  Clear throughout to auscultation.    Heart:  Regular rate and rhythm. Abdomen:  Soft, nontender and nondistended. Normal bowel sounds, without guarding, and without rebound.   Neurologic:  Alert and  oriented x4;  grossly normal neurologically.  Impression/Plan: [name] is here for an colonoscopy to be performed for poaitive stool test.  Risks, benefits, limitations, and alternatives regarding  colonoscopy have been reviewed with the patient.  Questions have been answered.  All parties agreeable.   [name], MD  01/03/2016, 7:45 AM

## 2016-01-03 NOTE — Anesthesia Postprocedure Evaluation (Signed)
Anesthesia Post Note  Patient: Daryl Cochran  Procedure(s) Performed: Procedure(s) (LRB): COLONOSCOPY WITH PROPOFOL (N/A) POLYPECTOMY  Patient location during evaluation: PACU Anesthesia Type: MAC Level of consciousness: awake and alert Pain management: pain level controlled Vital Signs Assessment: post-procedure vital signs reviewed and stable Respiratory status: spontaneous breathing, nonlabored ventilation, respiratory function stable and patient connected to nasal cannula oxygen Cardiovascular status: stable and blood pressure returned to baseline Anesthetic complications: no    Evangelos Paulino

## 2016-01-03 NOTE — Op Note (Signed)
[facility] Gastroenterology Patient Name: Daryl Cochran Procedure Date: 01/03/2016 7:48 AM MRN: [id number] Account #: [id number] Date of Birth: [birth year] Admit Type: Inpatient Age: 55 Room: [facility] OR ROOM 01 Gender: Male Note Status: Finalized Procedure:            Colonoscopy Indications:          Positive Cologuard test Providers:            [name] MD, MD Referring MD:         Halina Maidens, MD (Referring MD) Medicines:            Propofol per Anesthesia Complications:        No immediate complications. Procedure:            Pre-Anesthesia Assessment:                       - Prior to the procedure, a History and Physical was                        performed, and patient medications and allergies were                        reviewed. The patient's tolerance of previous                        anesthesia was also reviewed. The risks and benefits of                        the procedure and the sedation options and risks were                        discussed with the patient. All questions were                        answered, and informed consent was obtained. Prior                        Anticoagulants: The patient has taken no previous                        anticoagulant or antiplatelet agents. ASA Grade                        Assessment: II - A patient with mild systemic disease.                        After reviewing the risks and benefits, the patient was                        deemed in satisfactory condition to undergo the                        procedure.                       After obtaining informed consent, the colonoscope was                        passed under direct vision. Throughout the procedure,  the patient's blood pressure, pulse, and oxygen                        saturations were monitored continuously. The Olympus                        CF-HQ190L Colonoscope (S#. [id number]) was introduced   through the anus and advanced to the the cecum,                        identified by appendiceal orifice and ileocecal valve.                        The colonoscopy was performed without difficulty. The                        patient tolerated the procedure well. The quality of                        the bowel preparation was excellent. Findings:      The perianal and digital rectal examinations were normal.      A 5 mm polyp was found in the ascending colon. The polyp was sessile.       The polyp was removed with a cold snare. Resection and retrieval were       complete.      A 6 mm polyp was found in the rectum. The polyp was sessile. The polyp       was removed with a cold snare. Resection and retrieval were complete.      Non-bleeding internal hemorrhoids were found during retroflexion. The       hemorrhoids were Grade II (internal hemorrhoids that prolapse but reduce       spontaneously). Impression:           - One 5 mm polyp in the ascending colon, removed with a                        cold snare. Resected and retrieved.                       - One 6 mm polyp in the rectum, removed with a cold                        snare. Resected and retrieved.                       - Non-bleeding internal hemorrhoids. Recommendation:       - Await pathology results.                       - Repeat colonoscopy in 5 years if polyp adenoma and 10                        years if hyperplastic Procedure Code(s):    --- Professional ---                       [phone], Colonoscopy, flexible; with removal of tumor(s),                        polyp(s), or other lesion(s) by snare  technique Diagnosis Code(s):    --- Professional ---                       R19.5, Other fecal abnormalities                       D12.2, Benign neoplasm of ascending colon                       K62.1, Rectal polyp CPT copyright 2016 American Medical Association. All rights reserved. The codes documented in this report are  preliminary and upon coder review may  be revised to meet current compliance requirements. [name] MD, MD 01/03/2016 8:14:41 AM This report has been signed electronically. Number of Addenda: 0 Note Initiated On: 01/03/2016 7:48 AM Scope Withdrawal Time: 0 hours 7 minutes 48 seconds  Total Procedure Duration: 0 hours 10 minutes 20 seconds       [facility]

## 2016-01-03 NOTE — Transfer of Care (Signed)
Immediate Anesthesia Transfer of Care Note  Patient: Daryl Cochran  Procedure(s) Performed: Procedure(s) with comments: COLONOSCOPY WITH PROPOFOL (N/A) - Diabetic - oral meds POLYPECTOMY  Patient Location: PACU  Anesthesia Type: MAC  Level of Consciousness: awake, alert  and patient cooperative  Airway and Oxygen Therapy: Patient Spontanous Breathing and Patient connected to supplemental oxygen  Post-op Assessment: Post-op Vital signs reviewed, Patient's Cardiovascular Status Stable, Respiratory Function Stable, Patent Airway and No signs of Nausea or vomiting  Post-op Vital Signs: Reviewed and stable  Complications: No apparent anesthesia complications

## 2016-01-04 ENCOUNTER — Encounter: Payer: Self-pay | Admitting: Gastroenterology

## 2016-01-09 ENCOUNTER — Encounter: Payer: Self-pay | Admitting: Gastroenterology

## 2016-01-14 ENCOUNTER — Encounter: Payer: Self-pay | Admitting: Gastroenterology

## 2016-01-14 ENCOUNTER — Encounter: Payer: Self-pay | Admitting: Internal Medicine

## 2016-01-17 ENCOUNTER — Ambulatory Visit (INDEPENDENT_AMBULATORY_CARE_PROVIDER_SITE_OTHER): Payer: 59 | Admitting: Internal Medicine

## 2016-01-17 ENCOUNTER — Encounter: Payer: Self-pay | Admitting: Internal Medicine

## 2016-01-17 VITALS — BP 120/76 | HR 69 | Resp 16 | Ht 74.0 in | Wt 220.0 lb

## 2016-01-17 DIAGNOSIS — E785 Hyperlipidemia, unspecified: Secondary | ICD-10-CM

## 2016-01-17 DIAGNOSIS — D125 Benign neoplasm of sigmoid colon: Secondary | ICD-10-CM | POA: Diagnosis not present

## 2016-01-17 DIAGNOSIS — I10 Essential (primary) hypertension: Secondary | ICD-10-CM | POA: Diagnosis not present

## 2016-01-17 DIAGNOSIS — E119 Type 2 diabetes mellitus without complications: Secondary | ICD-10-CM | POA: Diagnosis not present

## 2016-01-17 MED ORDER — LISINOPRIL 10 MG PO TABS: 10.0000 mg | ORAL_TABLET | Freq: Every day | ORAL | 12 refills | Status: DC

## 2016-01-17 NOTE — Progress Notes (Signed)
Date:  01/17/2016   Name:  Daryl Cochran   DOB:  1960-08-20   MRN:  [id number]   Chief Complaint: Hypertension and Diabetes Hypertension  This is a chronic problem. The current episode started more than 1 year ago. The problem is unchanged. The problem is controlled. Pertinent negatives include no chest pain, headaches, palpitations or shortness of breath. Risk factors for coronary artery disease include dyslipidemia and diabetes mellitus. Past treatments include ACE inhibitors.  Diabetes  He presents for his follow-up diabetic visit. He has type 2 diabetes mellitus. His disease course has been stable. Pertinent negatives for hypoglycemia include no dizziness or headaches. Pertinent negatives for diabetes include no chest pain and no fatigue. Symptoms are stable.    Lab Results  Component Value Date   HGBA1C 6.6 (H) 09/17/2015   Wt Readings from Last 3 Encounters:  01/17/16 220 lb (99.8 kg)  01/03/16 217 lb (98.4 kg)  09/17/15 234 lb (106.1 kg)     Review of Systems  Constitutional: Negative for chills, fatigue and fever.  Respiratory: Negative for choking, chest tightness, shortness of breath and wheezing.   Cardiovascular: Negative for chest pain, palpitations and leg swelling.  Gastrointestinal: Negative for abdominal pain, constipation and diarrhea.  Genitourinary: Negative for difficulty urinating.  Neurological: Negative for dizziness and headaches.  Psychiatric/Behavioral: Negative for sleep disturbance.    Patient Active Problem List   Diagnosis Date Noted  . Adenomatous colon polyp   . Dyslipidemia 10/30/2014  . Essential (primary) hypertension 10/30/2014  . Type 2 diabetes mellitus without complication, without long-term current use of insulin ([place]) 10/30/2014    Prior to Admission medications   Medication Sig Start Date End Date Taking? Authorizing Provider  atorvastatin (LIPITOR) 10 MG tablet Take 1 tablet (10 mg total) by mouth daily. 09/17/15  Yes Glean Hess, MD  lisinopril (PRINIVIL,ZESTRIL) 10 MG tablet Take 1 tablet (10 mg total) by mouth daily. 08/14/15  Yes Glean Hess, MD  metFORMIN (GLUCOPHAGE-XR) 500 MG 24 hr tablet TAKE (2) TABLETS BY MOUTH EVERY DAY WITHEVENING MEAL 08/14/15  Yes Glean Hess, MD    No Known Allergies  Past Surgical History:  Procedure Laterality Date  . COLONOSCOPY WITH PROPOFOL N/A 01/03/2016   Procedure: COLONOSCOPY WITH PROPOFOL;  Surgeon: [name], MD;  Location: [place];  Service: Endoscopy;  Laterality: N/A;  Diabetic - oral meds  . FEMUR FRACTURE SURGERY Left    rod in leg "from hip to knee"  . HERNIA REPAIR     as child  . [place] SURGERY  1995  . POLYPECTOMY  01/03/2016   Procedure: POLYPECTOMY;  Surgeon: [name], MD;  Location: [place];  Service: Endoscopy;;  . TONSILLECTOMY      Social History  Substance Use Topics  . Smoking status: Never Smoker  . Smokeless tobacco: Never Used  . Alcohol use 2.4 oz/week    4 Standard drinks or equivalent per week     Medication list has been reviewed and updated.   Physical Exam  Constitutional: He is oriented to person, place, and time. He appears well-developed. No distress.  HENT:  Head: Normocephalic and atraumatic.  Neck: Normal range of motion. Neck supple. No thyromegaly present.  Cardiovascular: Normal rate, regular rhythm and normal heart sounds.   Pulmonary/Chest: Effort normal and breath sounds normal. No respiratory distress.  Musculoskeletal: He exhibits no edema or tenderness.  Neurological: He is alert and oriented to person, place, and time.  Skin: Skin is warm and dry. No rash noted.  Psychiatric: He has a normal mood and affect. His behavior is normal. Thought content normal.  Nursing note and vitals reviewed.   BP 120/76   Pulse 69   Resp 16   Ht 6\' 2"  (1.88 m)   Wt 220 lb (99.8 kg)   SpO2 98%   BMI 28.25 kg/m   Assessment and Plan: 1. Type 2 diabetes mellitus without  complication, without long-term current use of insulin (HCC) Controlled; continue diet and weight loss - Hemoglobin A1c  2. Essential (primary) hypertension controlled - lisinopril (PRINIVIL,ZESTRIL) 10 MG tablet; Take 1 tablet (10 mg total) by mouth daily.  Dispense: 30 tablet; Refill: 12  3. Dyslipidemia  on statin therapy  4. Adenomatous polyp of sigmoid colon Doing well s/p colonoscopy -repeat 5 yrs   Halina Maidens, MD [facility]  01/17/2016

## 2016-01-18 LAB — HEMOGLOBIN A1C
Est. average glucose Bld gHb Est-mCnc: 128 mg/dL
Hgb A1c MFr Bld: 6.1 % — ABNORMAL HIGH (ref 4.8–5.6)

## 2016-05-20 ENCOUNTER — Ambulatory Visit (INDEPENDENT_AMBULATORY_CARE_PROVIDER_SITE_OTHER): Payer: 59 | Admitting: Internal Medicine

## 2016-05-20 ENCOUNTER — Encounter: Payer: Self-pay | Admitting: Internal Medicine

## 2016-05-20 VITALS — BP 130/82 | HR 80 | Temp 98.1°F | Ht 74.0 in | Wt 216.0 lb

## 2016-05-20 DIAGNOSIS — E119 Type 2 diabetes mellitus without complications: Secondary | ICD-10-CM | POA: Diagnosis not present

## 2016-05-20 DIAGNOSIS — I10 Essential (primary) hypertension: Secondary | ICD-10-CM

## 2016-05-20 DIAGNOSIS — E785 Hyperlipidemia, unspecified: Secondary | ICD-10-CM

## 2016-05-20 MED ORDER — ATORVASTATIN CALCIUM 10 MG PO TABS: 10.0000 mg | ORAL_TABLET | Freq: Every day | ORAL | 5 refills | Status: DC

## 2016-05-20 NOTE — Patient Instructions (Addendum)

## 2016-05-20 NOTE — Progress Notes (Signed)
Date:  05/20/2016   Name:  Daryl Cochran   DOB:  1960-04-23   MRN:  [id number]   Chief Complaint: Hypertension and Diabetes Hypertension  This is a chronic problem. The current episode started more than 1 year ago. The problem is unchanged. The problem is controlled. Pertinent negatives include no chest pain, palpitations or shortness of breath. There are no associated agents to hypertension. Risk factors for coronary artery disease include diabetes mellitus. Past treatments include ACE inhibitors.  Diabetes  He presents for his follow-up diabetic visit. He has type 2 diabetes mellitus. His disease course has been stable. There are no hypoglycemic associated symptoms. Pertinent negatives for hypoglycemia include no nervousness/anxiousness. Pertinent negatives for diabetes include no chest pain and no fatigue. Symptoms are stable. He is compliant with treatment most of the time. His weight is decreasing steadily.  Hyperlipidemia  This is a chronic problem. Recent lipid tests were reviewed and are low. Pertinent negatives include no chest pain or shortness of breath. Current antihyperlipidemic treatment includes statins. The current treatment provides significant improvement of lipids.    Lab Results  Component Value Date   HGBA1C 6.1 (H) 01/17/2016   Wt Readings from Last 3 Encounters:  05/20/16 216 lb (98 kg)  01/17/16 220 lb (99.8 kg)  01/03/16 217 lb (98.4 kg)    Review of Systems  Constitutional: Positive for unexpected weight change (working in low carb diet - lost ~20 lbs in 8 months). Negative for chills, fatigue and fever.  Eyes: Negative for visual disturbance.  Respiratory: Negative for chest tightness, shortness of breath and wheezing.   Cardiovascular: Negative for chest pain, palpitations and leg swelling.  Gastrointestinal: Negative for abdominal pain and blood in stool.  Musculoskeletal: Positive for arthralgias (ankle).  Skin: Negative for color change and rash.    Psychiatric/Behavioral: Negative for sleep disturbance. The patient is not nervous/anxious.     Patient Active Problem List   Diagnosis Date Noted  . Adenomatous colon polyp   . Dyslipidemia 10/30/2014  . Essential (primary) hypertension 10/30/2014  . Type 2 diabetes mellitus without complication, without long-term current use of insulin ([place]) 10/30/2014    Prior to Admission medications   Medication Sig Start Date End Date Taking? Authorizing Provider  atorvastatin (LIPITOR) 10 MG tablet Take 1 tablet (10 mg total) by mouth daily. 09/17/15  Yes Glean Hess, MD  lisinopril (PRINIVIL,ZESTRIL) 10 MG tablet Take 1 tablet (10 mg total) by mouth daily. 01/17/16  Yes Glean Hess, MD  metFORMIN (GLUCOPHAGE-XR) 500 MG 24 hr tablet TAKE (2) TABLETS BY MOUTH EVERY DAY WITHEVENING MEAL 08/14/15  Yes Glean Hess, MD    No Known Allergies  Past Surgical History:  Procedure Laterality Date  . COLONOSCOPY WITH PROPOFOL N/A 01/03/2016   Procedure: COLONOSCOPY WITH PROPOFOL;  Surgeon: [name], MD;  Location: [place];  Service: Endoscopy;  Laterality: N/A;  Diabetic - oral meds  . FEMUR FRACTURE SURGERY Left    rod in leg "from hip to knee"  . HERNIA REPAIR     as child  . [place] SURGERY  1995  . POLYPECTOMY  01/03/2016   Procedure: POLYPECTOMY;  Surgeon: [name], MD;  Location: [place];  Service: Endoscopy;;  . TONSILLECTOMY      Social History  Substance Use Topics  . Smoking status: Never Smoker  . Smokeless tobacco: Never Used  . Alcohol use 2.4 oz/week    4 Standard drinks or equivalent per week  Medication list has been reviewed and updated.   Physical Exam  Constitutional: He is oriented to person, place, and time. He appears well-developed. No distress.  HENT:  Head: Normocephalic and atraumatic.  Neck: Normal range of motion.  Cardiovascular: Normal rate, regular rhythm and normal heart sounds.   Pulmonary/Chest: Effort  normal and breath sounds normal. No respiratory distress.  Musculoskeletal: He exhibits no edema or tenderness.  Neurological: He is alert and oriented to person, place, and time.  Skin: Skin is warm and dry. No rash noted.  Psychiatric: He has a normal mood and affect. His behavior is normal. Thought content normal.  Nursing note and vitals reviewed.   BP 130/82   Pulse 80   Temp 98.1 F (36.7 C)   Ht 6\' 2"  (1.88 m)   Wt 216 lb (98 kg)   SpO2 98%   BMI 27.73 kg/m   Assessment and Plan: 1. Dyslipidemia - atorvastatin (LIPITOR) 10 MG tablet; Take 1 tablet (10 mg total) by mouth daily.  Dispense: 30 tablet; Refill: 5  2. Type 2 diabetes mellitus without complication, without long-term current use of insulin ([place]) May be able to reduce dose of metformin Discussed PPV-23 - he will consider it Reminded to get Eye exam - Hemoglobin A1c  3. Essential (primary) hypertension controlled - Basic metabolic panel   Halina Maidens, MD [facility]  05/20/2016

## 2016-05-21 LAB — BASIC METABOLIC PANEL
BUN/Creatinine Ratio: 14 (ref 9–20)
BUN: 14 mg/dL (ref 6–24)
CALCIUM: 9.5 mg/dL (ref 8.7–10.2)
CO2: 24 mmol/L (ref 18–29)
CREATININE: 1 mg/dL (ref 0.76–1.27)
Chloride: 103 mmol/L (ref 96–106)
GFR, EST AFRICAN AMERICAN: 98 mL/min/{1.73_m2} (ref >59)
GFR, EST NON AFRICAN AMERICAN: 84 mL/min/{1.73_m2} (ref >59)
Glucose: 109 mg/dL — ABNORMAL HIGH (ref 65–99)
POTASSIUM: 4.9 mmol/L (ref 3.5–5.2)
Sodium: 144 mmol/L (ref 134–144)

## 2016-05-21 LAB — HEMOGLOBIN A1C
Est. average glucose Bld gHb Est-mCnc: 126 mg/dL
HEMOGLOBIN A1C: 6 % — AB (ref 4.8–5.6)

## 2016-10-06 ENCOUNTER — Encounter: Payer: Self-pay | Admitting: Internal Medicine

## 2016-11-17 ENCOUNTER — Telehealth: Payer: Self-pay

## 2016-11-17 ENCOUNTER — Other Ambulatory Visit: Payer: Self-pay | Admitting: Internal Medicine

## 2016-11-17 DIAGNOSIS — E119 Type 2 diabetes mellitus without complications: Secondary | ICD-10-CM

## 2016-11-17 MED ORDER — PREDNISONE 10 MG PO TABS: 30.0000 mg | ORAL_TABLET | Freq: Every day | ORAL | 0 refills | Status: AC

## 2016-11-17 NOTE — Telephone Encounter (Signed)
Spoke to pt and informed prednisone was sent. Sent to [facility] to schedule follow up in next 30 days for DM and CPE when next available. Pt verbalized understanding and was transferred.

## 2016-11-17 NOTE — Telephone Encounter (Signed)
He was supposed to see me this month for a CPX - he has not scheduled it.  I am reluctant to call in prednisone but I will.  He MUST schedule DM follow up this month and a CPX as soon as an opening is available.

## 2016-11-17 NOTE — Telephone Encounter (Signed)
[place] on hands. Highly allergic. 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 Does he do MUC or can you call in low dose med?

## 2016-12-05 ENCOUNTER — Encounter: Payer: Self-pay | Admitting: Internal Medicine

## 2016-12-05 ENCOUNTER — Ambulatory Visit (INDEPENDENT_AMBULATORY_CARE_PROVIDER_SITE_OTHER): Payer: 59 | Admitting: Internal Medicine

## 2016-12-05 VITALS — BP 132/80 | HR 66 | Ht 74.0 in | Wt 208.0 lb

## 2016-12-05 DIAGNOSIS — I10 Essential (primary) hypertension: Secondary | ICD-10-CM

## 2016-12-05 DIAGNOSIS — E785 Hyperlipidemia, unspecified: Secondary | ICD-10-CM | POA: Diagnosis not present

## 2016-12-05 DIAGNOSIS — E119 Type 2 diabetes mellitus without complications: Secondary | ICD-10-CM

## 2016-12-05 MED ORDER — ATORVASTATIN CALCIUM 10 MG PO TABS: 10.0000 mg | ORAL_TABLET | Freq: Every day | ORAL | 12 refills | Status: DC

## 2016-12-05 NOTE — Progress Notes (Signed)
Date:  12/05/2016   Name:  Daryl Cochran   DOB:  [birth year]   MRN:  [id number]   Chief Complaint: Hypertension and Diabetes Hypertension  Pertinent negatives include no chest pain, headaches, palpitations or shortness of breath.  Diabetes  Pertinent negatives for hypoglycemia include no headaches or tremors. Pertinent negatives for diabetes include no chest pain, no fatigue, no polydipsia and no polyuria.  Hyperlipidemia  Pertinent negatives include no chest pain or shortness of breath.  Ran out of meds 2 weeks ago - pharmacy did not request a refill.  Lab Results  Component Value Date   HGBA1C 6.0 (H) 05/20/2016    Review of Systems  Constitutional: Negative for appetite change, fatigue and unexpected weight change.  Eyes: Negative for visual disturbance.  Respiratory: Negative for cough, shortness of breath and wheezing.   Cardiovascular: Negative for chest pain, palpitations and leg swelling.  Gastrointestinal: Negative for abdominal pain and blood in stool.  Endocrine: Negative for polydipsia and polyuria.  Genitourinary: Negative for dysuria and hematuria.  Skin: Negative for color change and rash.  Neurological: Negative for tremors, numbness and headaches.  Psychiatric/Behavioral: Negative for dysphoric mood.    Patient Active Problem List   Diagnosis Date Noted  . Adenomatous colon polyp   . Dyslipidemia 10/30/2014  . Essential (primary) hypertension 10/30/2014  . Type 2 diabetes mellitus without complication, without long-term current use of insulin ([place]) 10/30/2014    Prior to Admission medications   Medication Sig Start Date End Date Taking? Authorizing Provider  lisinopril (PRINIVIL,ZESTRIL) 10 MG tablet Take 1 tablet (10 mg total) by mouth daily. 01/17/16  Yes Glean Hess, MD  metFORMIN (GLUCOPHAGE-XR) 500 MG 24 hr tablet TAKE (2) TABLETS BY MOUTH EVERY DAY Mariel Aloe 11/18/16  Yes Glean Hess, MD  atorvastatin (LIPITOR) 10 MG tablet Take 1  tablet (10 mg total) by mouth daily. Patient not taking: Reported on 12/05/2016 05/20/16   Glean Hess, MD    No Known Allergies  Past Surgical History:  Procedure Laterality Date  . COLONOSCOPY WITH PROPOFOL N/A 01/03/2016   Procedure: COLONOSCOPY WITH PROPOFOL;  Surgeon: [name], MD;  Location: [place];  Service: Endoscopy;  Laterality: N/A;  Diabetic - oral meds  . FEMUR FRACTURE SURGERY Left    rod in leg "from hip to knee"  . HERNIA REPAIR     as child  . [place] SURGERY  1995  . POLYPECTOMY  01/03/2016   Procedure: POLYPECTOMY;  Surgeon: [name], MD;  Location: [place];  Service: Endoscopy;;  . TONSILLECTOMY      Social History  Substance Use Topics  . Smoking status: Never Smoker  . Smokeless tobacco: Never Used  . Alcohol use 2.4 oz/week    4 Standard drinks or equivalent per week     Medication list has been reviewed and updated.  PHQ 2/9 Scores 12/05/2016 08/14/2015  PHQ - 2 Score 0 0    Physical Exam  Constitutional: He is oriented to person, place, and time. He appears well-developed. No distress.  HENT:  Head: Normocephalic and atraumatic.  Neck: Normal range of motion. Neck supple. No thyromegaly present.  Cardiovascular: Normal rate, regular rhythm and normal heart sounds.   Pulmonary/Chest: Effort normal and breath sounds normal. No respiratory distress. He has no wheezes.  Musculoskeletal: Normal range of motion. He exhibits no edema or tenderness.  Neurological: He is alert and oriented to person, place, and time.  Skin: Skin is warm and  dry. No rash noted.  Psychiatric: He has a normal mood and affect. His behavior is normal. Thought content normal.  Nursing note and vitals reviewed.   BP 132/80   Pulse 66   Ht 6\' 2"  (1.88 m)   Wt 208 lb (94.3 kg)   SpO2 98%   BMI 26.71 kg/m   Assessment and Plan: 1. Essential (primary) hypertension controlled  2. Type 2 diabetes mellitus without complication, without  long-term current use of insulin (HCC) Continue metformin once a day - Hemoglobin A1c  3. Dyslipidemia Resume statin therapy - atorvastatin (LIPITOR) 10 MG tablet; Take 1 tablet (10 mg total) by mouth daily.  Dispense: 30 tablet; Refill: 12   Meds ordered this encounter  Medications  . atorvastatin (LIPITOR) 10 MG tablet    Sig: Take 1 tablet (10 mg total) by mouth daily.    Dispense:  30 tablet    Refill:  [place], MD [facility]  12/05/2016

## 2016-12-06 LAB — HEMOGLOBIN A1C
ESTIMATED AVERAGE GLUCOSE: 128 mg/dL
Hgb A1c MFr Bld: 6.1 % — ABNORMAL HIGH (ref 4.8–5.6)

## 2017-05-15 ENCOUNTER — Encounter: Payer: Self-pay | Admitting: Internal Medicine

## 2017-11-30 ENCOUNTER — Encounter: Payer: Self-pay | Admitting: Internal Medicine

## 2017-11-30 ENCOUNTER — Other Ambulatory Visit
Admission: RE | Admit: 2017-11-30 | Discharge: 2017-11-30 | Disposition: A | Discharge status: Home / Self Care | Payer: 59 | Source: Ambulatory Visit | Attending: Internal Medicine | Admitting: Internal Medicine

## 2017-11-30 ENCOUNTER — Ambulatory Visit: Payer: 59 | Admitting: Internal Medicine

## 2017-11-30 ENCOUNTER — Ambulatory Visit
Admission: RE | Admit: 2017-11-30 | Discharge: 2017-11-30 | Disposition: A | Discharge status: Home / Self Care | Payer: 59 | Source: Ambulatory Visit | Attending: Internal Medicine | Admitting: Internal Medicine

## 2017-11-30 VITALS — BP 142/84 | HR 100 | Temp 100.1°F | Ht 74.0 in | Wt 208.0 lb

## 2017-11-30 DIAGNOSIS — R319 Hematuria, unspecified: Secondary | ICD-10-CM

## 2017-11-30 DIAGNOSIS — E119 Type 2 diabetes mellitus without complications: Secondary | ICD-10-CM

## 2017-11-30 DIAGNOSIS — N401 Enlarged prostate with lower urinary tract symptoms: Secondary | ICD-10-CM

## 2017-11-30 DIAGNOSIS — R05 Cough: Secondary | ICD-10-CM | POA: Diagnosis present

## 2017-11-30 DIAGNOSIS — J069 Acute upper respiratory infection, unspecified: Secondary | ICD-10-CM

## 2017-11-30 DIAGNOSIS — R509 Fever, unspecified: Secondary | ICD-10-CM | POA: Insufficient documentation

## 2017-11-30 DIAGNOSIS — R350 Frequency of micturition: Secondary | ICD-10-CM

## 2017-11-30 DIAGNOSIS — J984 Other disorders of lung: Secondary | ICD-10-CM | POA: Diagnosis not present

## 2017-11-30 DIAGNOSIS — I10 Essential (primary) hypertension: Secondary | ICD-10-CM

## 2017-11-30 DIAGNOSIS — F39 Unspecified mood [affective] disorder: Secondary | ICD-10-CM

## 2017-11-30 HISTORY — DX: Unspecified mood (affective) disorder: F39

## 2017-11-30 HISTORY — DX: Hematuria, unspecified: R31.9

## 2017-11-30 LAB — COMPREHENSIVE METABOLIC PANEL
ALT: 25 U/L (ref 0–44)
AST: 28 U/L (ref 15–41)
Albumin: 3.8 g/dL (ref 3.5–5.0)
Alkaline Phosphatase: 67 U/L (ref 38–126)
Anion gap: 15 (ref 5–15)
BILIRUBIN TOTAL: 0.7 mg/dL (ref 0.3–1.2)
BUN: 17 mg/dL (ref 6–20)
CO2: 23 mmol/L (ref 22–32)
CREATININE: 1.05 mg/dL (ref 0.61–1.24)
Calcium: 9 mg/dL (ref 8.9–10.3)
Chloride: 98 mmol/L (ref 98–111)
GFR calc Af Amer: >60 mL/min (ref >60)
Glucose, Bld: 106 mg/dL — ABNORMAL HIGH (ref 70–99)
POTASSIUM: 3.4 mmol/L — AB (ref 3.5–5.1)
Sodium: 136 mmol/L (ref 135–145)
TOTAL PROTEIN: 7.9 g/dL (ref 6.5–8.1)

## 2017-11-30 LAB — POCT URINALYSIS DIPSTICK
BILIRUBIN UA: NEGATIVE
GLUCOSE UA: NEGATIVE
KETONES UA: 40
Leukocytes, UA: NEGATIVE
Nitrite, UA: NEGATIVE
PROTEIN UA: POSITIVE — AB
Spec Grav, UA: 1.015 (ref 1.010–1.025)
Urobilinogen, UA: 0.2 E.U./dL
pH, UA: 5 (ref 5.0–8.0)

## 2017-11-30 LAB — CBC WITH DIFFERENTIAL/PLATELET
BASOS ABS: 0.1 10*3/uL (ref 0–0.1)
Basophils Relative: 1 %
Eosinophils Absolute: 0.1 10*3/uL (ref 0–0.7)
Eosinophils Relative: 0 %
HEMATOCRIT: 40.2 % (ref 40.0–52.0)
Hemoglobin: 13.9 g/dL (ref 13.0–18.0)
LYMPHS ABS: 1.5 10*3/uL (ref 1.0–3.6)
LYMPHS PCT: 11 %
MCH: 28.8 pg (ref 26.0–34.0)
MCHC: 34.5 g/dL (ref 32.0–36.0)
MCV: 83.4 fL (ref 80.0–100.0)
MONO ABS: 0.6 10*3/uL (ref 0.2–1.0)
MONOS PCT: 5 %
NEUTROS ABS: 11.4 10*3/uL — AB (ref 1.4–6.5)
Neutrophils Relative %: 83 %
Platelets: 451 10*3/uL — ABNORMAL HIGH (ref 150–440)
RBC: 4.82 MIL/uL (ref 4.40–5.90)
RDW: 13.1 % (ref 11.5–14.5)
WBC: 13.7 10*3/uL — ABNORMAL HIGH (ref 3.8–10.6)

## 2017-11-30 LAB — HEMOGLOBIN A1C
Hgb A1c MFr Bld: 5.8 % — ABNORMAL HIGH (ref 4.8–5.6)
Mean Plasma Glucose: 119.76 mg/dL

## 2017-11-30 LAB — TSH: TSH: 3.315 u[IU]/mL (ref 0.350–4.500)

## 2017-11-30 LAB — PSA: PROSTATIC SPECIFIC ANTIGEN: 0.99 ng/mL (ref 0.00–4.00)

## 2017-11-30 MED ORDER — DOXYCYCLINE HYCLATE 100 MG PO TABS: 100.0000 mg | ORAL_TABLET | Freq: Two times a day (BID) | ORAL | 0 refills | Status: AC

## 2017-11-30 MED ORDER — ESCITALOPRAM OXALATE 10 MG PO TABS: 10.0000 mg | ORAL_TABLET | Freq: Every day | ORAL | 1 refills | Status: DC

## 2017-11-30 NOTE — Progress Notes (Signed)
Date:  11/30/2017   Name:  Daryl Cochran   DOB:  08/26/1960   MRN:  [id number]   Chief Complaint: Cough (Feeling off. SOB, coughing and feels like top of "head is coming off". Production coming up but unsure of color. Coughing 9 days in a row. Low grade fever. Tired. Wheezing.) and Urinary Urgency (Uses the bathroom 2 types at night. Trouble making it to the bathroom without using the bathroom on self. Can not hold urine long.)  Cough  This is a new problem. The current episode started in the past 7 days. The problem has been unchanged. The problem occurs every few minutes. The cough is productive of sputum. Associated symptoms include a fever, headaches, shortness of breath and wheezing. Pertinent negatives include no chest pain, chills or sore throat. He has tried nothing for the symptoms. There is no history of asthma, COPD, emphysema, environmental allergies or pneumonia.  Urinary Frequency   This is a chronic problem. The current episode started more than 1 year ago. The patient is experiencing no pain. There has been no fever. Associated symptoms include frequency and hesitancy. Pertinent negatives include no chills. Associated symptoms comments: Only at night - 1-2 times. He has tried nothing for the symptoms.  Diabetes  He presents for his follow-up diabetic visit. He has type 2 diabetes mellitus. Hypoglycemia symptoms include headaches and nervousness/anxiousness. Pertinent negatives for hypoglycemia include no dizziness or tremors. Associated symptoms include fatigue. Pertinent negatives for diabetes include no chest pain and no weakness. When asked about current treatments, none were reported. An ACE inhibitor/angiotensin II receptor blocker is not being taken.  Hypertension  This is a chronic (at home some readings are elevated, others are normal) problem. The problem is unchanged. Associated symptoms include headaches and shortness of breath. Pertinent negatives include no chest  pain or palpitations. Treatments tried: stopped medication when A1C was good.     Mood disorder - increasing over the past year or more.  He and his wife has custody of 3 grandchildren, each with special needs.  He is stressed by finances as well the behavior of the children.  He continues to work full time.  He feels anxious and his have trouble sleeping.  He tried melatonin without benefit.  He denies suicidal thoughts but gets angry frequently with the situation, not with any one person.  Lab Results  Component Value Date   HGBA1C 6.1 (H) 12/05/2016   Lab Results  Component Value Date   CREATININE 1.00 05/20/2016   BUN 14 05/20/2016   NA 144 05/20/2016   K 4.9 05/20/2016   CL 103 05/20/2016   CO2 24 05/20/2016   Lab Results  Component Value Date   PSA 0.9 10/29/2012      Review of Systems  Constitutional: Positive for fatigue and fever. Negative for chills.  HENT: Negative for sinus pressure, sore throat and trouble swallowing.   Eyes: Negative for visual disturbance.  Respiratory: Positive for cough, shortness of breath and wheezing.   Cardiovascular: Negative for chest pain, palpitations and leg swelling.  Gastrointestinal: Negative for abdominal pain.  Genitourinary: Positive for frequency and hesitancy.  Musculoskeletal: Negative for arthralgias.  Allergic/Immunologic: Negative for environmental allergies.  Neurological: Positive for headaches. Negative for dizziness, tremors, weakness and light-headedness.  Hematological: Negative for adenopathy.  Psychiatric/Behavioral: Positive for agitation, dysphoric mood and sleep disturbance. Negative for self-injury and suicidal ideas. The patient is nervous/anxious.     Patient Active Problem List   Diagnosis Date  Noted  . Adenomatous colon polyp   . Dyslipidemia 10/30/2014  . Essential (primary) hypertension 10/30/2014  . Type 2 diabetes mellitus without complication, without long-term current use of insulin ([place])  10/30/2014    No Known Allergies  Past Surgical History:  Procedure Laterality Date  . COLONOSCOPY WITH PROPOFOL N/A 01/03/2016   Procedure: COLONOSCOPY WITH PROPOFOL;  Surgeon: [name], MD;  Location: [facility];  Service: Endoscopy;  Laterality: N/A;  Diabetic - oral meds  . FEMUR FRACTURE SURGERY Left    rod in leg "from hip to knee"  . HERNIA REPAIR     as child  . [place] SURGERY  1995  . POLYPECTOMY  01/03/2016   Procedure: POLYPECTOMY;  Surgeon: [name], MD;  Location: [place] [facility];  Service: Endoscopy;;  . TONSILLECTOMY      Social History   Tobacco Use  . Smoking status: Never Smoker  . Smokeless tobacco: Never Used  Substance Use Topics  . Alcohol use: Yes    Alcohol/week: 4.0 standard drinks    Types: 4 Standard drinks or equivalent per week  . Drug use: Never     Medication list has been reviewed and updated.  No outpatient medications have been marked as taking for the 11/30/17 encounter (Office Visit) with Glean Hess, MD.    [facility] 2/9 Scores 12/05/2016 08/14/2015  PHQ - 2 Score 0 0    Physical Exam  Constitutional: He is oriented to person, place, and time. He appears well-developed. No distress.  HENT:  Head: Normocephalic and atraumatic.  Neck: Normal range of motion. Neck supple.  Cardiovascular: Normal rate, regular rhythm and normal heart sounds.  Pulmonary/Chest: Effort normal and breath sounds normal. No respiratory distress. He has no wheezes. He has no rales.  Abdominal: Soft. Bowel sounds are normal. He exhibits no mass. There is no tenderness. There is no guarding.  Genitourinary: Rectum normal. Rectal exam shows no external hemorrhoid and no internal hemorrhoid. Prostate is enlarged. Prostate is not tender.  Musculoskeletal: Normal range of motion.  Lymphadenopathy:    He has no cervical adenopathy.  Neurological: He is alert and oriented to person, place, and time.  Skin: Skin is warm and dry. No rash noted.    Psychiatric: His speech is normal and behavior is normal. Judgment and thought content normal. His mood appears anxious. His affect is not inappropriate. Thought content is not delusional. Cognition and memory are normal. He expresses no suicidal plans. He is attentive.  Nursing note and vitals reviewed.   BP (!) 142/84   Pulse 100   Temp 100.1 F (37.8 C) (Oral)   Ht 6\' 2"  (1.88 m)   Wt 208 lb (94.3 kg)   SpO2 94%   BMI 26.71 kg/m   Assessment and Plan: 1. Upper respiratory tract infection, unspecified type Take tylenol, increase fluids - doxycycline (VIBRA-TABS) 100 MG tablet; Take 1 tablet (100 mg total) by mouth 2 (two) times daily for 10 days.  Dispense: 20 tablet; Refill: 0 - DG Chest 2 View; Future - CBC with Differential/Platelet  2. Benign prostatic hyperplasia with urinary frequency Will need PSA before starting medication UA + 3-5 RBCs - likely due to fever, will recheck next visit - PSA - POCT urinalysis dipstick   3. Essential (primary) hypertension May need to resume medication - hold for now until acute illness resolved - Comprehensive metabolic panel - TSH  4. Type 2 diabetes mellitus without complication, without long-term current use of insulin (HCC) Check  labs - Hemoglobin A1c  5. Mood disorder ([place]) Begin medication Follow up in 6 weeks or sooner if needed Simply Sleep at HS - escitalopram (LEXAPRO) 10 MG tablet; Take 1 tablet (10 mg total) by mouth daily.  Dispense: 30 tablet; Refill: 1  6. Hematuria, unspecified type Will recheck next visit   Meds ordered this encounter  Medications  . doxycycline (VIBRA-TABS) 100 MG tablet    Sig: Take 1 tablet (100 mg total) by mouth 2 (two) times daily for 10 days.    Dispense:  20 tablet    Refill:  0  . escitalopram (LEXAPRO) 10 MG tablet    Sig: Take 1 tablet (10 mg total) by mouth daily.    Dispense:  30 tablet    Refill:  1    Partially dictated using Editor, commissioning. Any errors are  unintentional.  Halina Maidens, MD [facility]  11/30/2017

## 2017-11-30 NOTE — Patient Instructions (Signed)
Take Tylenol regularly for body aches and headache and fever.  Increase fluids, including gatorade, etc  Suicidal Feelings: How to Help Yourself Suicide is the taking of one's own life. If you feel as though life is getting too tough to handle and are thinking about suicide, get help right away. To get help:  Call your local emergency services (911 in the [place]  Call a suicide hotline to speak with a trained counselor who understands how you are feeling. The following is a list of suicide hotlines in the [place]. For a list of hotlines in [place], visit FindSkins.pl. ? 1-800-273-TALK 334-567-3079). ? 1-800-SUICIDE 4350677602). ? (801)148-2646. This is a hotline for Spanish speakers. ? 1-800-799-4TTY 2505529690). This is a hotline for TTY users. ? 1-866-4-U-TREVOR 437-803-6664). This is a hotline for lesbian, gay, bisexual, transgender, or questioning youth.  Contact a crisis center or a local suicide prevention center. To find a crisis center or suicide prevention center: ? Call your local hospital, clinic, community service organization, mental health center, social service provider, or health department. Ask for assistance in connecting to a crisis center. ? Visit BankingRep.com.au for a list of crisis centers in the [place], or visit www.suicideprevention.ca/thinking-about-suicide/find-a-crisis-centre for a list of centers in [place].  Visit the following websites: ? National Suicide Prevention Lifeline: www.suicidepreventionlifeline.org ? Hopeline: www.hopeline.com ? [employer] for Suicide Prevention: PromotionalLoans.co.za ? The [employer] (for lesbian, gay, bisexual, transgender, or questioning youth): www.thetrevorproject.org  How can I help myself feel better?  Promise yourself that you will not do anything drastic when you have suicidal feelings. Remember,  there is hope. Many people have gotten through suicidal thoughts and feelings, and you will, too. You may have gotten through them before, and this proves that you can get through them again.  Let family, friends, teachers, or counselors know how you are feeling. Try not to isolate yourself from those who care about you. Remember, they will want to help you. Talk with someone every day, even if you do not feel sociable. Face-to-face conversation is best.  Call a mental health professional and see one regularly.  Visit your primary health care provider every year.  Eat a well-balanced diet, and space your meals so you eat regularly.  Get plenty of rest.  Avoid alcohol and drugs, and remove them from your home. They will only make you feel worse.  If you are thinking of taking a lot of medicine, give your medicine to someone who can give it to you one day at a time. If you are on antidepressants and are concerned you will overdose, let your health care provider know so he or she can give you safer medicines. Ask your mental health professional about the possible side effects of any medicines you are taking.  Remove weapons, poisons, knives, and anything else that could harm you from your home.  Try to stick to routines. Follow a schedule every day. Put self-care on your schedule.  Make a list of realistic goals, and cross them off when you achieve them. Accomplishments give a sense of worth.  Wait until you are feeling better before doing the things you find difficult or unpleasant.  Exercise if you are able. You will feel better if you exercise for even a half hour each day.  Go out in the sun or into nature. This will help you recover from depression faster. If you have a favorite place to walk, go there.  Do the things that have always given you pleasure.  Play your favorite music, read a good book, paint a picture, play your favorite instrument, or do anything else that takes your mind  off your depression if it is safe to do.  Keep your living space well lit.  When you are feeling well, write yourself a letter about tips and support that you can read when you are not feeling well.  Remember that life's difficulties can be sorted out with help. Conditions can be treated. You can work on thoughts and strategies that serve you well. This information is not intended to replace advice given to you by your health care provider. Make sure you discuss any questions you have with your health care provider. Document Released: 10/05/2002 Document Revised: 11/28/2015 Document Reviewed: 07/26/2013 Elsevier Interactive Patient Education  [employer].

## 2018-01-11 ENCOUNTER — Ambulatory Visit: Payer: Self-pay | Admitting: Internal Medicine

## 2018-01-15 ENCOUNTER — Ambulatory Visit: Payer: 59 | Admitting: Internal Medicine

## 2018-01-15 ENCOUNTER — Encounter: Payer: Self-pay | Admitting: Internal Medicine

## 2018-01-15 VITALS — BP 124/82 | HR 85 | Resp 16 | Ht 72.0 in | Wt 208.0 lb

## 2018-01-15 DIAGNOSIS — R319 Hematuria, unspecified: Secondary | ICD-10-CM

## 2018-01-15 DIAGNOSIS — F39 Unspecified mood [affective] disorder: Secondary | ICD-10-CM | POA: Diagnosis not present

## 2018-01-15 LAB — POC URINALYSIS WITH MICROSCOPIC (NON AUTO)MANUAL RESULT
BILIRUBIN UA: NEGATIVE
Bacteria, UA: 0
Crystals: 0
Epithelial cells, urine per micros: 0
GLUCOSE UA: NEGATIVE
Ketones, UA: NEGATIVE
LEUKOCYTES UA: NEGATIVE
Mucus, UA: 0
Nitrite, UA: NEGATIVE
PH UA: 7 (ref 5.0–8.0)
PROTEIN UA: NEGATIVE
RBC: 0 M/uL — AB (ref 4.69–6.13)
SPEC GRAV UA: 1.015 (ref 1.010–1.025)
UROBILINOGEN UA: 0.2 U/dL
WBC Casts, UA: 0

## 2018-01-15 MED ORDER — ESCITALOPRAM OXALATE 10 MG PO TABS: 10.0000 mg | ORAL_TABLET | Freq: Every day | ORAL | 5 refills | Status: DC

## 2018-01-15 NOTE — Progress Notes (Signed)
Date:  01/15/2018   Name:  Daryl Cochran   DOB:  1960-10-11   MRN:  [id number]   Chief Complaint: Depression  Depression         This is a new problem.  The current episode started more than 1 month ago.   The problem has been gradually improving since onset.  Associated symptoms include no fatigue, no appetite change, no body aches, no headaches, no indigestion and no suicidal ideas.     The symptoms are aggravated by family issues.  Past treatments include SSRIs - Selective serotonin reuptake inhibitors.  Compliance with treatment is good.  Previous treatment provided significant relief.  Hematuria - seen on last visit without sx.  He was complaining of hesitancy of urine.  Since then he has started going as soon as the urge hits and is doing much better.  He has seen no blood in his urine, no dysuria, flank pain or abdominal pain.   Review of Systems  Constitutional: Negative for appetite change, chills, fatigue and fever.  Respiratory: Negative for cough, chest tightness, shortness of breath and wheezing.   Cardiovascular: Negative for chest pain and palpitations.  Gastrointestinal: Negative for abdominal pain.  Genitourinary: Negative for decreased urine volume, dysuria and hematuria.  Neurological: Negative for dizziness and headaches.  Psychiatric/Behavioral: Positive for depression and dysphoric mood (much improved). Negative for suicidal ideas. The patient is not nervous/anxious.     Patient Active Problem List   Diagnosis Date Noted  . Hematuria 11/30/2017  . Mood disorder ([place]) 11/30/2017  . Benign prostatic hyperplasia with urinary frequency 11/30/2017  . Adenomatous colon polyp   . Dyslipidemia 10/30/2014  . Essential (primary) hypertension 10/30/2014  . Type 2 diabetes mellitus without complication, without long-term current use of insulin ([place]) 10/30/2014    No Known Allergies  Past Surgical History:  Procedure Laterality Date  . COLONOSCOPY WITH PROPOFOL  N/A 01/03/2016   Procedure: COLONOSCOPY WITH PROPOFOL;  Surgeon: [name], MD;  Location: [facility];  Service: Endoscopy;  Laterality: N/A;  Diabetic - oral meds  . FEMUR FRACTURE SURGERY Left    rod in leg "from hip to knee"  . HERNIA REPAIR     as child  . [place] SURGERY  1995  . POLYPECTOMY  01/03/2016   Procedure: POLYPECTOMY;  Surgeon: [name], MD;  Location: [place];  Service: Endoscopy;;  . TONSILLECTOMY      Social History   Tobacco Use  . Smoking status: Never Smoker  . Smokeless tobacco: Never Used  Substance Use Topics  . Alcohol use: Yes    Alcohol/week: 4.0 standard drinks    Types: 4 Standard drinks or equivalent per week  . Drug use: Never     Medication list has been reviewed and updated.  Current Meds  Medication Sig  . escitalopram (LEXAPRO) 10 MG tablet Take 1 tablet (10 mg total) by mouth daily.    PHQ 2/9 Scores 01/15/2018 12/05/2016 08/14/2015  PHQ - 2 Score 0 0 0    Physical Exam  Constitutional: He is oriented to person, place, and time. He appears well-developed. No distress.  HENT:  Head: Normocephalic and atraumatic.  Neck: Normal range of motion. Neck supple.  Cardiovascular: Normal rate, regular rhythm and normal heart sounds.  Pulmonary/Chest: Effort normal and breath sounds normal. No respiratory distress. He has no wheezes. He has no rales.  Lymphadenopathy:    He has no cervical adenopathy.  Neurological: He is alert and  oriented to person, place, and time.  Skin: Skin is warm and dry. No rash noted.  Psychiatric: He has a normal mood and affect. His speech is normal and behavior is normal. Judgment and thought content normal.  Nursing note and vitals reviewed.   BP 124/82   Pulse 85   Resp 16   Ht 6' (1.829 m)   Wt 208 lb (94.3 kg)   SpO2 95%   BMI 28.21 kg/m   Assessment and Plan: 1. Mood disorder ([place]) Doing well on current therapy - escitalopram (LEXAPRO) 10 MG tablet; Take 1 tablet (10 mg  total) by mouth daily.  Dispense: 30 tablet; Refill: 5  2. Hematuria, unspecified type UA and micro negative today - POC urinalysis w microscopic (non auto)   Partially dictated using Editor, commissioning. Any errors are unintentional.  Halina Maidens, MD [facility]  01/15/2018

## 2018-05-20 ENCOUNTER — Other Ambulatory Visit: Payer: Self-pay

## 2018-05-20 ENCOUNTER — Encounter: Payer: Self-pay | Admitting: Internal Medicine

## 2018-05-20 ENCOUNTER — Ambulatory Visit: Payer: 59 | Admitting: Internal Medicine

## 2018-05-20 VITALS — BP 128/70 | HR 74 | Ht 74.0 in | Wt 229.0 lb

## 2018-05-20 DIAGNOSIS — R7303 Prediabetes: Secondary | ICD-10-CM | POA: Diagnosis not present

## 2018-05-20 DIAGNOSIS — F39 Unspecified mood [affective] disorder: Secondary | ICD-10-CM

## 2018-05-20 DIAGNOSIS — I10 Essential (primary) hypertension: Secondary | ICD-10-CM | POA: Diagnosis not present

## 2018-05-20 NOTE — Progress Notes (Signed)
Date:  05/20/2018   Name:  Daryl Cochran   DOB:  12/02/1960   MRN:  [id number]   Chief Complaint: Diabetes  Diabetes  He presents for his follow-up diabetic visit. Diabetes type: prediabetic. Pertinent negatives for hypoglycemia include no headaches or tremors. Pertinent negatives for diabetes include no blurred vision, no chest pain, no fatigue, no foot paresthesias, no polydipsia, no polyphagia, no polyuria, no visual change and no weight loss. Symptoms are stable. Current diabetic treatment includes diet. He is compliant with treatment most of the time. His weight is increasing steadily. He is following a generally healthy diet. There is no compliance with monitoring of blood glucose. An ACE inhibitor/angiotensin II receptor blocker is not being taken.  Hypertension  The problem has been resolved since onset. Pertinent negatives include no blurred vision, chest pain, headaches, palpitations or shortness of breath. Past treatments include ACE inhibitors (stopped when A1C < 6).  Mood disorder - doing well now, stopped lexapro without side effects.   Review of Systems  Constitutional: Negative for appetite change, fatigue, unexpected weight change and weight loss.  Eyes: Negative for blurred vision and visual disturbance.  Respiratory: Negative for cough, shortness of breath and wheezing.   Cardiovascular: Negative for chest pain, palpitations and leg swelling.  Gastrointestinal: Negative for abdominal pain and blood in stool.  Endocrine: Negative for polydipsia, polyphagia and polyuria.  Genitourinary: Negative for dysuria and hematuria.  Skin: Negative for color change and rash.  Neurological: Negative for tremors, numbness and headaches.  Psychiatric/Behavioral: Negative for dysphoric mood.    Patient Active Problem List   Diagnosis Date Noted  . Mood disorder ([place]) 11/30/2017  . Benign prostatic hyperplasia with urinary frequency 11/30/2017  . Adenomatous colon polyp   .  Dyslipidemia 10/30/2014  . Essential (primary) hypertension 10/30/2014  . Prediabetes 10/30/2014    No Known Allergies  Past Surgical History:  Procedure Laterality Date  . COLONOSCOPY WITH PROPOFOL N/A 01/03/2016   Procedure: COLONOSCOPY WITH PROPOFOL;  Surgeon: [name], MD;  Location: [facility];  Service: Endoscopy;  Laterality: N/A;  Diabetic - oral meds  . FEMUR FRACTURE SURGERY Left    rod in leg "from hip to knee"  . HERNIA REPAIR     as child  . [place] SURGERY  1995  . POLYPECTOMY  01/03/2016   Procedure: POLYPECTOMY;  Surgeon: [name], MD;  Location: [facility];  Service: Endoscopy;;  . TONSILLECTOMY      Social History   Tobacco Use  . Smoking status: Never Smoker  . Smokeless tobacco: Never Used  Substance Use Topics  . Alcohol use: Yes    Alcohol/week: 4.0 standard drinks    Types: 4 Standard drinks or equivalent per week  . Drug use: Never     Medication list has been reviewed and updated.  No outpatient medications have been marked as taking for the 05/20/18 encounter (Office Visit) with Glean Hess, MD.    [facility] 2/9 Scores 05/20/2018 01/15/2018 12/05/2016 08/14/2015  PHQ - 2 Score 0 0 0 0   Wt Readings from Last 3 Encounters:  05/20/18 229 lb (103.9 kg)  01/15/18 208 lb (94.3 kg)  11/30/17 208 lb (94.3 kg)    Physical Exam Constitutional:      Appearance: Normal appearance.  Eyes:     Pupils: Pupils are equal, round, and reactive to light.  Neck:     Musculoskeletal: Normal range of motion and neck supple.     Vascular:  No carotid bruit.  Cardiovascular:     Rate and Rhythm: Normal rate and regular rhythm.     Pulses: Normal pulses.     Heart sounds: Normal heart sounds.  Pulmonary:     Effort: Pulmonary effort is normal. No respiratory distress.     Breath sounds: Normal breath sounds. No wheezing.  Musculoskeletal: Normal range of motion.     Right lower leg: No edema.     Left lower leg: No edema.    Lymphadenopathy:     Cervical: No cervical adenopathy.  Neurological:     General: No focal deficit present.     Mental Status: He is alert and oriented to person, place, and time.  Psychiatric:        Mood and Affect: Mood normal.        Behavior: Behavior normal.     BP 128/70   Pulse 74   Ht 6\' 2"  (1.88 m)   Wt 229 lb (103.9 kg)   SpO2 94%   BMI 29.40 kg/m   Assessment and Plan: 1. Prediabetes Likely worsening with recent weight gain Begin home glucose monitoring Will restart metformin if A1C > 7 - Hemoglobin A1c - Comprehensive metabolic panel  2. Essential (primary) hypertension Normal readings on no medications - CBC with Differential/Platelet  3. Mood disorder ([place]) Resolved, no longer taking SSRI   Partially dictated using Editor, commissioning. Any errors are unintentional.  Halina Maidens, MD [facility]  05/20/2018

## 2018-05-21 LAB — CBC WITH DIFFERENTIAL/PLATELET
BASOS ABS: 0.1 10*3/uL (ref 0.0–0.2)
Basos: 1 %
EOS (ABSOLUTE): 0.2 10*3/uL (ref 0.0–0.4)
EOS: 2 %
HEMATOCRIT: 50.7 % (ref 37.5–51.0)
Hemoglobin: 16.5 g/dL (ref 13.0–17.7)
IMMATURE GRANULOCYTES: 1 %
Immature Grans (Abs): 0.1 10*3/uL (ref 0.0–0.1)
LYMPHS ABS: 3 10*3/uL (ref 0.7–3.1)
Lymphs: 34 %
MCH: 28.7 pg (ref 26.6–33.0)
MCHC: 32.5 g/dL (ref 31.5–35.7)
MCV: 88 fL (ref 79–97)
MONOS ABS: 0.7 10*3/uL (ref 0.1–0.9)
Monocytes: 8 %
Neutrophils Absolute: 4.6 10*3/uL (ref 1.4–7.0)
Neutrophils: 54 %
Platelets: 304 10*3/uL (ref 150–450)
RBC: 5.75 x10E6/uL (ref 4.14–5.80)
RDW: 13.4 % (ref 11.6–15.4)
WBC: 8.6 10*3/uL (ref 3.4–10.8)

## 2018-05-21 LAB — COMPREHENSIVE METABOLIC PANEL
A/G RATIO: 2.4 — AB (ref 1.2–2.2)
ALT: 24 IU/L (ref 0–44)
AST: 18 IU/L (ref 0–40)
Albumin: 4.8 g/dL (ref 3.8–4.9)
Alkaline Phosphatase: 66 IU/L (ref 39–117)
BUN / CREAT RATIO: 11 (ref 9–20)
BUN: 11 mg/dL (ref 6–24)
Bilirubin Total: 0.2 mg/dL (ref 0.0–1.2)
CALCIUM: 9.5 mg/dL (ref 8.7–10.2)
CO2: 23 mmol/L (ref 20–29)
Chloride: 103 mmol/L (ref 96–106)
Creatinine, Ser: 1.02 mg/dL (ref 0.76–1.27)
GFR, EST AFRICAN AMERICAN: 94 mL/min/{1.73_m2} (ref >59)
GFR, EST NON AFRICAN AMERICAN: 81 mL/min/{1.73_m2} (ref >59)
GLOBULIN, TOTAL: 2 g/dL (ref 1.5–4.5)
Glucose: 106 mg/dL — ABNORMAL HIGH (ref 65–99)
POTASSIUM: 4.8 mmol/L (ref 3.5–5.2)
SODIUM: 142 mmol/L (ref 134–144)
Total Protein: 6.8 g/dL (ref 6.0–8.5)

## 2018-05-21 LAB — HEMOGLOBIN A1C
ESTIMATED AVERAGE GLUCOSE: 146 mg/dL
HEMOGLOBIN A1C: 6.7 % — AB (ref 4.8–5.6)

## 2018-12-29 ENCOUNTER — Encounter: Payer: Self-pay | Admitting: Internal Medicine

## 2019-01-26 ENCOUNTER — Encounter: Payer: Self-pay | Admitting: Internal Medicine

## 2019-01-26 ENCOUNTER — Other Ambulatory Visit: Payer: Self-pay

## 2019-01-26 ENCOUNTER — Ambulatory Visit (INDEPENDENT_AMBULATORY_CARE_PROVIDER_SITE_OTHER): Payer: 59 | Admitting: Internal Medicine

## 2019-01-26 ENCOUNTER — Other Ambulatory Visit: Payer: Self-pay | Admitting: Internal Medicine

## 2019-01-26 VITALS — BP 106/70 | HR 83 | Ht 74.0 in | Wt 208.0 lb

## 2019-01-26 DIAGNOSIS — N401 Enlarged prostate with lower urinary tract symptoms: Secondary | ICD-10-CM

## 2019-01-26 DIAGNOSIS — M19171 Post-traumatic osteoarthritis, right ankle and foot: Secondary | ICD-10-CM

## 2019-01-26 DIAGNOSIS — R7303 Prediabetes: Secondary | ICD-10-CM

## 2019-01-26 DIAGNOSIS — Z0000 Encounter for general adult medical examination without abnormal findings: Secondary | ICD-10-CM

## 2019-01-26 DIAGNOSIS — R350 Frequency of micturition: Secondary | ICD-10-CM

## 2019-01-26 DIAGNOSIS — I10 Essential (primary) hypertension: Secondary | ICD-10-CM | POA: Diagnosis not present

## 2019-01-26 DIAGNOSIS — M19079 Primary osteoarthritis, unspecified ankle and foot: Secondary | ICD-10-CM | POA: Insufficient documentation

## 2019-01-26 DIAGNOSIS — E785 Hyperlipidemia, unspecified: Secondary | ICD-10-CM

## 2019-01-26 LAB — POCT URINALYSIS DIPSTICK
Bilirubin, UA: NEGATIVE
Glucose, UA: NEGATIVE
Ketones, UA: NEGATIVE
Leukocytes, UA: NEGATIVE
Nitrite, UA: NEGATIVE
Protein, UA: NEGATIVE
Spec Grav, UA: 1.015 (ref 1.010–1.025)
Urobilinogen, UA: 0.2 E.U./dL
pH, UA: 5 (ref 5.0–8.0)

## 2019-01-26 NOTE — Progress Notes (Signed)
Date:  01/26/2019   Name:  Tunis Dunk   DOB:  16-Nov-1960   MRN:  [id number]   Chief Complaint: Annual Exam (Declined flu shot.) and Diabetes [facility]) Daryl Cochran is a 58 y.o. male who presents today for his Complete Annual Exam. He feels well. He reports exercising some but losing weight. He reports he is sleeping fairly well.   Colonoscopy  2017 - repeat 2022 Immunizations  Diabetes He presents for his follow-up diabetic visit. He has type 2 diabetes mellitus. His disease course has been stable. Pertinent negatives for hypoglycemia include no dizziness or headaches. Pertinent negatives for diabetes include no chest pain and no fatigue. Current diabetic treatment includes diet. An ACE inhibitor/angiotensin II receptor blocker is not being taken.   Lab Results  Component Value Date   HGBA1C 6.7 (H) 05/20/2018   Lab Results  Component Value Date   CREATININE 1.02 05/20/2018   BUN 11 05/20/2018   NA 142 05/20/2018   K 4.8 05/20/2018   CL 103 05/20/2018   CO2 23 05/20/2018     Review of Systems  Constitutional: Negative for appetite change, chills, diaphoresis, fatigue and unexpected weight change.  HENT: Negative for hearing loss, tinnitus, trouble swallowing and voice change.   Eyes: Negative for visual disturbance.  Respiratory: Negative for choking, shortness of breath and wheezing.   Cardiovascular: Negative for chest pain, palpitations and leg swelling.  Gastrointestinal: Negative for abdominal pain, blood in stool, constipation and diarrhea.  Genitourinary: Negative for difficulty urinating, dysuria and frequency.       Nocturia x2  Musculoskeletal: Positive for arthralgias (right ankle). Negative for back pain and myalgias.  Skin: Negative for color change and rash.  Neurological: Negative for dizziness, syncope and headaches.  Hematological: Negative for adenopathy.  Psychiatric/Behavioral: Negative for dysphoric mood and sleep disturbance.     Patient Active Problem List   Diagnosis Date Noted  . Benign prostatic hyperplasia with urinary frequency 11/30/2017  . Adenomatous colon polyp   . Dyslipidemia 10/30/2014  . Essential (primary) hypertension 10/30/2014  . Prediabetes 10/30/2014    No Known Allergies  Past Surgical History:  Procedure Laterality Date  . COLONOSCOPY WITH PROPOFOL N/A 01/03/2016   Procedure: COLONOSCOPY WITH PROPOFOL;  Surgeon: [name], MD;  Location: [place];  Service: Endoscopy;  Laterality: N/A;  Diabetic - oral meds  . FEMUR FRACTURE SURGERY Left    rod in leg "from hip to knee"  . HERNIA REPAIR     as child  . [place] SURGERY  1995  . POLYPECTOMY  01/03/2016   Procedure: POLYPECTOMY;  Surgeon: [name], MD;  Location: [place];  Service: Endoscopy;;  . TONSILLECTOMY      Social History   Tobacco Use  . Smoking status: Never Smoker  . Smokeless tobacco: Never Used  Substance Use Topics  . Alcohol use: Yes    Alcohol/week: 4.0 standard drinks    Types: 4 Standard drinks or equivalent per week  . Drug use: Never     Medication list has been reviewed and updated.  No outpatient medications have been marked as taking for the 01/26/19 encounter (Office Visit) with Glean Hess, MD.    [facility] 2/9 Scores 01/26/2019 05/20/2018 01/15/2018 12/05/2016  PHQ - 2 Score 0 0 0 0  PHQ- 9 Score 0 - - -    BP Readings from Last 3 Encounters:  01/26/19 106/70  05/20/18 128/70  01/15/18 124/82    Physical Exam  Vitals signs and nursing note reviewed.  Constitutional:      Appearance: Normal appearance. He is well-developed.  HENT:     Head: Normocephalic.     Right Ear: Tympanic membrane, ear canal and external ear normal.     Left Ear: Tympanic membrane, ear canal and external ear normal.     Nose: Nose normal.     Mouth/Throat:     Pharynx: Uvula midline.  Eyes:     Conjunctiva/sclera: Conjunctivae normal.     Pupils: Pupils are equal, round, and reactive  to light.  Neck:     Musculoskeletal: Normal range of motion and neck supple.     Thyroid: No thyromegaly.     Vascular: No carotid bruit.  Cardiovascular:     Rate and Rhythm: Normal rate and regular rhythm.     Heart sounds: Normal heart sounds.  Pulmonary:     Effort: Pulmonary effort is normal.     Breath sounds: Normal breath sounds. No wheezing.  Chest:     Breasts:        Right: No mass.        Left: No mass.  Abdominal:     General: Bowel sounds are normal.     Palpations: Abdomen is soft.     Tenderness: There is no abdominal tenderness.  Musculoskeletal: Normal range of motion.     Right ankle: He exhibits deformity (OA changes, flat feet).  Lymphadenopathy:     Cervical: No cervical adenopathy.  Skin:    General: Skin is warm and dry.  Neurological:     Mental Status: He is alert and oriented to person, place, and time.     Deep Tendon Reflexes: Reflexes are normal and symmetric.  Psychiatric:        Attention and Perception: Attention normal.        Mood and Affect: Mood normal.        Speech: Speech normal.        Behavior: Behavior normal.        Thought Content: Thought content normal.        Judgment: Judgment normal.     Wt Readings from Last 3 Encounters:  01/26/19 208 lb (94.3 kg)  05/20/18 229 lb (103.9 kg)  01/15/18 208 lb (94.3 kg)    BP 106/70   Pulse 83   Ht 6\' 2"  (1.88 m)   Wt 208 lb (94.3 kg)   SpO2 93%   BMI 26.71 kg/m   Assessment and Plan: 1. Annual physical exam Normal exam Recommend regular exercise as able - POCT urinalysis dipstick  2. Prediabetes Doing well, should be improved with ongoing weight loss Last A1C 6.7 - if higher will recommend medications - Comprehensive metabolic panel - Hemoglobin A1c - Microalbumin / creatinine urine ratio  3. Dyslipidemia Check labs and advise - Lipid panel  4. Essential (primary) hypertension Not currently a problem Continue healthy diet, weight control - CBC with  Differential/Platelet  5. Benign prostatic hyperplasia with urinary frequency DRE deferred to minimal sx - PSA  6. Post-traumatic osteoarthritis of right ankle Stable chronic sx limiting his exercise ability Taking Aleve about once per week as needed No intervention indicated at this time   Partially dictated using [employer]. Any errors are unintentional.  Halina Maidens, MD [facility]  01/26/2019

## 2019-01-27 LAB — COMPREHENSIVE METABOLIC PANEL
ALT: 17 IU/L (ref 0–44)
AST: 12 IU/L (ref 0–40)
Albumin/Globulin Ratio: 2.3 — ABNORMAL HIGH (ref 1.2–2.2)
Albumin: 5 g/dL — ABNORMAL HIGH (ref 3.8–4.9)
Alkaline Phosphatase: 66 IU/L (ref 39–117)
BUN/Creatinine Ratio: 20 (ref 9–20)
BUN: 19 mg/dL (ref 6–24)
Bilirubin Total: 0.4 mg/dL (ref 0.0–1.2)
CO2: 20 mmol/L (ref 20–29)
Calcium: 9.4 mg/dL (ref 8.7–10.2)
Chloride: 105 mmol/L (ref 96–106)
Creatinine, Ser: 0.95 mg/dL (ref 0.76–1.27)
GFR calc Af Amer: 102 mL/min/{1.73_m2} (ref >59)
GFR calc non Af Amer: 88 mL/min/{1.73_m2} (ref >59)
Globulin, Total: 2.2 g/dL (ref 1.5–4.5)
Glucose: 110 mg/dL — ABNORMAL HIGH (ref 65–99)
Potassium: 4.8 mmol/L (ref 3.5–5.2)
Sodium: 142 mmol/L (ref 134–144)
Total Protein: 7.2 g/dL (ref 6.0–8.5)

## 2019-01-27 LAB — LIPID PANEL
Chol/HDL Ratio: 6.2 ratio — ABNORMAL HIGH (ref 0.0–5.0)
Cholesterol, Total: 206 mg/dL — ABNORMAL HIGH (ref 100–199)
HDL: 33 mg/dL — ABNORMAL LOW (ref >39)
LDL Chol Calc (NIH): 146 mg/dL — ABNORMAL HIGH (ref 0–99)
Triglycerides: 147 mg/dL (ref 0–149)
VLDL Cholesterol Cal: 27 mg/dL (ref 5–40)

## 2019-01-27 LAB — HEMOGLOBIN A1C
Est. average glucose Bld gHb Est-mCnc: 126 mg/dL
Hgb A1c MFr Bld: 6 % — ABNORMAL HIGH (ref 4.8–5.6)

## 2019-01-27 LAB — CBC WITH DIFFERENTIAL/PLATELET
Basophils Absolute: 0.1 10*3/uL (ref 0.0–0.2)
Basos: 1 %
EOS (ABSOLUTE): 0.2 10*3/uL (ref 0.0–0.4)
Eos: 2 %
Hematocrit: 48.5 % (ref 37.5–51.0)
Hemoglobin: 16.2 g/dL (ref 13.0–17.7)
Immature Grans (Abs): 0.1 10*3/uL (ref 0.0–0.1)
Immature Granulocytes: 1 %
Lymphocytes Absolute: 2.9 10*3/uL (ref 0.7–3.1)
Lymphs: 25 %
MCH: 28.8 pg (ref 26.6–33.0)
MCHC: 33.4 g/dL (ref 31.5–35.7)
MCV: 86 fL (ref 79–97)
Monocytes Absolute: 0.9 10*3/uL (ref 0.1–0.9)
Monocytes: 8 %
Neutrophils Absolute: 7.4 10*3/uL — ABNORMAL HIGH (ref 1.4–7.0)
Neutrophils: 63 %
Platelets: 267 10*3/uL (ref 150–450)
RBC: 5.62 x10E6/uL (ref 4.14–5.80)
RDW: 12.9 % (ref 11.6–15.4)
WBC: 11.6 10*3/uL — ABNORMAL HIGH (ref 3.4–10.8)

## 2019-01-27 LAB — MICROALBUMIN / CREATININE URINE RATIO
Creatinine, Urine: 141.9 mg/dL
Microalb/Creat Ratio: 6 mg/g creat (ref 0–29)
Microalbumin, Urine: 8.3 ug/mL

## 2019-01-27 LAB — PSA: Prostate Specific Ag, Serum: 1.2 ng/mL (ref 0.0–4.0)

## 2019-01-28 ENCOUNTER — Other Ambulatory Visit: Payer: Self-pay | Admitting: Internal Medicine

## 2019-01-28 DIAGNOSIS — E785 Hyperlipidemia, unspecified: Secondary | ICD-10-CM

## 2019-01-28 MED ORDER — ATORVASTATIN CALCIUM 10 MG PO TABS: 10.0000 mg | ORAL_TABLET | Freq: Every day | ORAL | 1 refills | Status: DC

## 2019-04-18 ENCOUNTER — Encounter: Payer: Self-pay | Admitting: Internal Medicine

## 2019-04-18 ENCOUNTER — Other Ambulatory Visit: Payer: Self-pay

## 2019-07-20 ENCOUNTER — Ambulatory Visit: Payer: 59 | Admitting: Internal Medicine

## 2019-07-20 NOTE — Progress Notes (Deleted)
    Date:  07/20/2019   Name:  Daryl Cochran   DOB:  06/16/1960   MRN:  [id number]   Chief Complaint: No chief complaint on file.  Diabetes  Hyperlipidemia    Lab Results  Component Value Date   CREATININE 0.95 01/26/2019   BUN 19 01/26/2019   NA 142 01/26/2019   K 4.8 01/26/2019   CL 105 01/26/2019   CO2 20 01/26/2019   Lab Results  Component Value Date   CHOL 206 (H) 01/26/2019   HDL 33 (L) 01/26/2019   LDLCALC 146 (H) 01/26/2019   TRIG 147 01/26/2019   CHOLHDL 6.2 (H) 01/26/2019   Lab Results  Component Value Date   TSH 3.315 11/30/2017   Lab Results  Component Value Date   HGBA1C 6.0 (H) 01/26/2019   Lab Results  Component Value Date   WBC 11.6 (H) 01/26/2019   HGB 16.2 01/26/2019   HCT 48.5 01/26/2019   MCV 86 01/26/2019   PLT 267 01/26/2019   Lab Results  Component Value Date   ALT 17 01/26/2019   AST 12 01/26/2019   ALKPHOS 66 01/26/2019   BILITOT 0.4 01/26/2019     Review of Systems  Patient Active Problem List   Diagnosis Date Noted  . OA (osteoarthritis) of ankle 01/26/2019  . Benign prostatic hyperplasia with urinary frequency 11/30/2017  . Adenomatous colon polyp   . Dyslipidemia 10/30/2014  . Essential (primary) hypertension 10/30/2014  . Prediabetes 10/30/2014    No Known Allergies  Past Surgical History:  Procedure Laterality Date  . COLONOSCOPY WITH PROPOFOL N/A 01/03/2016   Procedure: COLONOSCOPY WITH PROPOFOL;  Surgeon: [name], MD;  Location: [place];  Service: Endoscopy;  Laterality: N/A;  Diabetic - oral meds  . FEMUR FRACTURE SURGERY Left    rod in leg "from hip to knee"  . HERNIA REPAIR     as child  . [place] SURGERY  1995  . POLYPECTOMY  01/03/2016   Procedure: POLYPECTOMY;  Surgeon: [name], MD;  Location: [place];  Service: Endoscopy;;  . TONSILLECTOMY      Social History   Tobacco Use  . Smoking status: Never Smoker  . Smokeless tobacco: Never Used  Substance Use  Topics  . Alcohol use: Yes    Alcohol/week: 4.0 standard drinks    Types: 4 Standard drinks or equivalent per week  . Drug use: Never     Medication list has been reviewed and updated.  No outpatient medications have been marked as taking for the 07/20/19 encounter (Appointment) with Glean Hess, MD.    [facility] 2/9 Scores 04/18/2019 01/26/2019 05/20/2018 01/15/2018  PHQ - 2 Score 0 0 0 0  PHQ- 9 Score - 0 - -    BP Readings from Last 3 Encounters:  04/18/19 128/80  01/26/19 106/70  05/20/18 128/70    Physical Exam  Wt Readings from Last 3 Encounters:  04/18/19 218 lb (98.9 kg)  01/26/19 208 lb (94.3 kg)  05/20/18 229 lb (103.9 kg)    There were no vitals taken for this visit.  Assessment and Plan:

## 2019-08-11 ENCOUNTER — Other Ambulatory Visit: Payer: Self-pay | Admitting: Internal Medicine

## 2019-08-11 DIAGNOSIS — E785 Hyperlipidemia, unspecified: Secondary | ICD-10-CM

## 2020-01-30 ENCOUNTER — Encounter: Payer: Managed Care, Other (non HMO) | Admitting: Internal Medicine

## 2020-01-30 NOTE — Progress Notes (Deleted)
Date:  01/30/2020   Name:  Daryl Cochran   DOB:  1960-07-29   MRN:  [id number]   Chief Complaint: No chief complaint on file. Daryl Cochran is a 59 y.o. male who presents today for his Complete Annual Exam. He feels {DESC; WELL/FAIRLY WELL/POORLY:18703}. He reports exercising ***. He reports he is sleeping {DESC; WELL/FAIRLY WELL/POORLY:18703}.   Colonoscopy: 12/2015 TA repeat 2022   There is no immunization history on file for this patient.  Hyperlipidemia This is a chronic problem. The problem is controlled. Pertinent negatives include no chest pain, myalgias or shortness of breath. Current antihyperlipidemic treatment includes statins. The current treatment provides significant improvement of lipids.  Diabetes He presents for his follow-up diabetic visit. Diabetes type: prediabetes. Pertinent negatives for hypoglycemia include no dizziness or headaches. Pertinent negatives for diabetes include no chest pain and no fatigue. Symptoms are stable. Current diabetic treatment includes diet. He is compliant with treatment most of the time.    Lab Results  Component Value Date   CREATININE 0.95 01/26/2019   BUN 19 01/26/2019   NA 142 01/26/2019   K 4.8 01/26/2019   CL 105 01/26/2019   CO2 20 01/26/2019   Lab Results  Component Value Date   CHOL 206 (H) 01/26/2019   HDL 33 (L) 01/26/2019   LDLCALC 146 (H) 01/26/2019   TRIG 147 01/26/2019   CHOLHDL 6.2 (H) 01/26/2019   Lab Results  Component Value Date   TSH 3.315 11/30/2017   Lab Results  Component Value Date   HGBA1C 6.0 (H) 01/26/2019   Lab Results  Component Value Date   WBC 11.6 (H) 01/26/2019   HGB 16.2 01/26/2019   HCT 48.5 01/26/2019   MCV 86 01/26/2019   PLT 267 01/26/2019   Lab Results  Component Value Date   ALT 17 01/26/2019   AST 12 01/26/2019   ALKPHOS 66 01/26/2019   BILITOT 0.4 01/26/2019     Review of Systems  Constitutional: Negative for appetite change, chills, diaphoresis,  fatigue and unexpected weight change.  HENT: Negative for hearing loss, tinnitus, trouble swallowing and voice change.   Eyes: Negative for visual disturbance.  Respiratory: Negative for choking, shortness of breath and wheezing.   Cardiovascular: Negative for chest pain, palpitations and leg swelling.  Gastrointestinal: Negative for abdominal pain, blood in stool, constipation and diarrhea.  Genitourinary: Negative for difficulty urinating, dysuria and frequency.  Musculoskeletal: Negative for arthralgias, back pain and myalgias.  Skin: Negative for color change and rash.  Neurological: Negative for dizziness, syncope and headaches.  Hematological: Negative for adenopathy.  Psychiatric/Behavioral: Negative for dysphoric mood and sleep disturbance.    Patient Active Problem List   Diagnosis Date Noted  . OA (osteoarthritis) of ankle 01/26/2019  . Benign prostatic hyperplasia with urinary frequency 11/30/2017  . Adenomatous colon polyp   . Dyslipidemia 10/30/2014  . Essential (primary) hypertension 10/30/2014  . Prediabetes 10/30/2014    No Known Allergies  Past Surgical History:  Procedure Laterality Date  . COLONOSCOPY WITH PROPOFOL N/A 01/03/2016   Procedure: COLONOSCOPY WITH PROPOFOL;  Surgeon: [name], MD;  Location: [place];  Service: Endoscopy;  Laterality: N/A;  Diabetic - oral meds  . FEMUR FRACTURE SURGERY Left    rod in leg "from hip to knee"  . HERNIA REPAIR     as child  . [place] SURGERY  1995  . POLYPECTOMY  01/03/2016   Procedure: POLYPECTOMY;  Surgeon: [name], MD;  Location: [place]  [facility];  Service: Endoscopy;;  . TONSILLECTOMY      Social History   Tobacco Use  . Smoking status: Never Smoker  . Smokeless tobacco: Never Used  Substance Use Topics  . Alcohol use: Yes    Alcohol/week: 4.0 standard drinks    Types: 4 Standard drinks or equivalent per week  . Drug use: Never     Medication list has been reviewed and  updated.  No outpatient medications have been marked as taking for the 01/30/20 encounter (Appointment) with Glean Hess, MD.    [facility] 2/9 Scores 04/18/2019 01/26/2019 05/20/2018 01/15/2018  PHQ - 2 Score 0 0 0 0  PHQ- 9 Score - 0 - -    No flowsheet data found.  BP Readings from Last 3 Encounters:  04/18/19 128/80  01/26/19 106/70  05/20/18 128/70    Physical Exam Vitals and nursing note reviewed.  Constitutional:      Appearance: Normal appearance. He is well-developed.  HENT:     Head: Normocephalic.     Right Ear: Tympanic membrane, ear canal and external ear normal.     Left Ear: Tympanic membrane, ear canal and external ear normal.     Nose: Nose normal.     Mouth/Throat:     Pharynx: Uvula midline.  Eyes:     Conjunctiva/sclera: Conjunctivae normal.     Pupils: Pupils are equal, round, and reactive to light.  Neck:     Thyroid: No thyromegaly.     Vascular: No carotid bruit.  Cardiovascular:     Rate and Rhythm: Normal rate and regular rhythm.     Heart sounds: Normal heart sounds.  Pulmonary:     Effort: Pulmonary effort is normal.     Breath sounds: Normal breath sounds. No wheezing.  Chest:     Breasts:        Right: No mass.        Left: No mass.  Abdominal:     General: Bowel sounds are normal.     Palpations: Abdomen is soft.     Tenderness: There is no abdominal tenderness.  Musculoskeletal:        General: Normal range of motion.     Cervical back: Normal range of motion and neck supple.  Lymphadenopathy:     Cervical: No cervical adenopathy.  Skin:    General: Skin is warm and dry.  Neurological:     Mental Status: He is alert and oriented to person, place, and time.     Deep Tendon Reflexes: Reflexes are normal and symmetric.  Psychiatric:        Speech: Speech normal.        Behavior: Behavior normal.        Thought Content: Thought content normal.        Judgment: Judgment normal.     Wt Readings from Last 3 Encounters:  04/18/19  218 lb (98.9 kg)  01/26/19 208 lb (94.3 kg)  05/20/18 229 lb (103.9 kg)    There were no vitals taken for this visit.  Assessment and Plan:

## 2020-02-09 IMAGING — CR DG CHEST 2V
2 series · 3 of 3 positions shown · non-contrast
Comparison: None.

CLINICAL DATA: Upper respiratory tract infection.

EXAM:
CHEST - 2 VIEW

[Series 1: chest pa · 0.14mm/px · 2 of 2 slices shown]
[im 1/2]
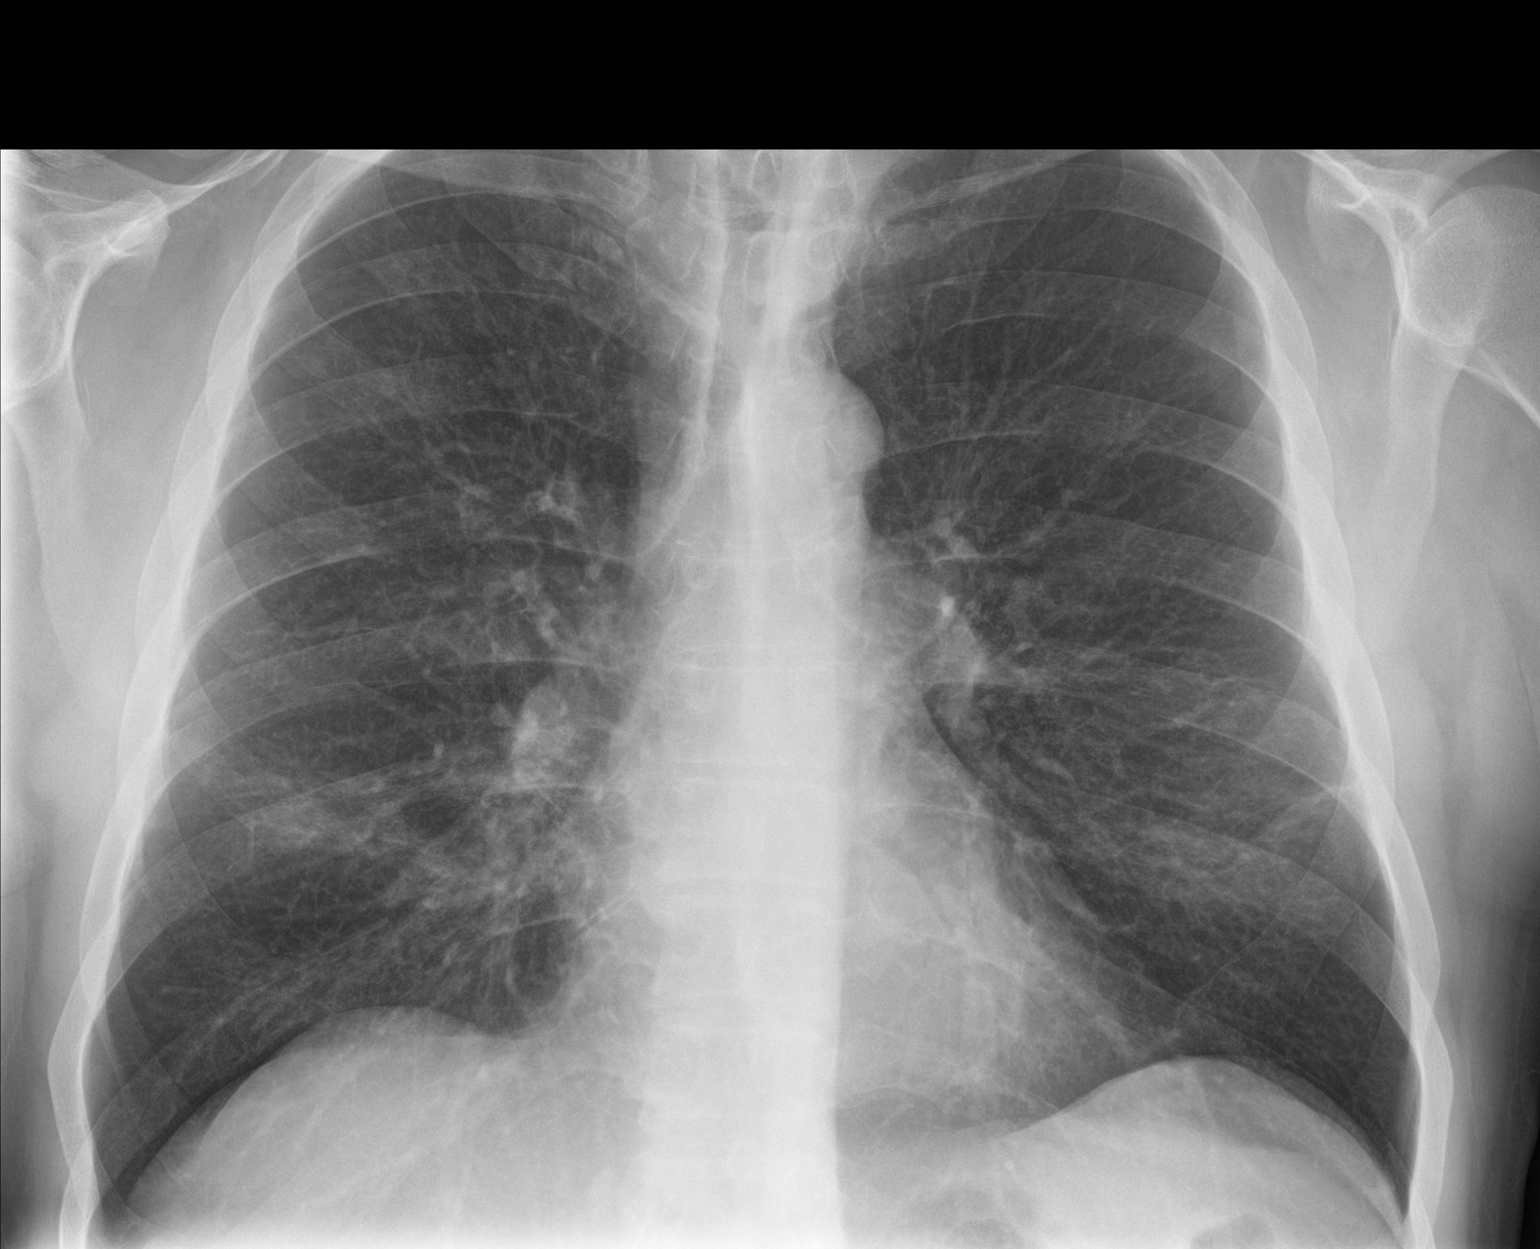
[im 2/2]
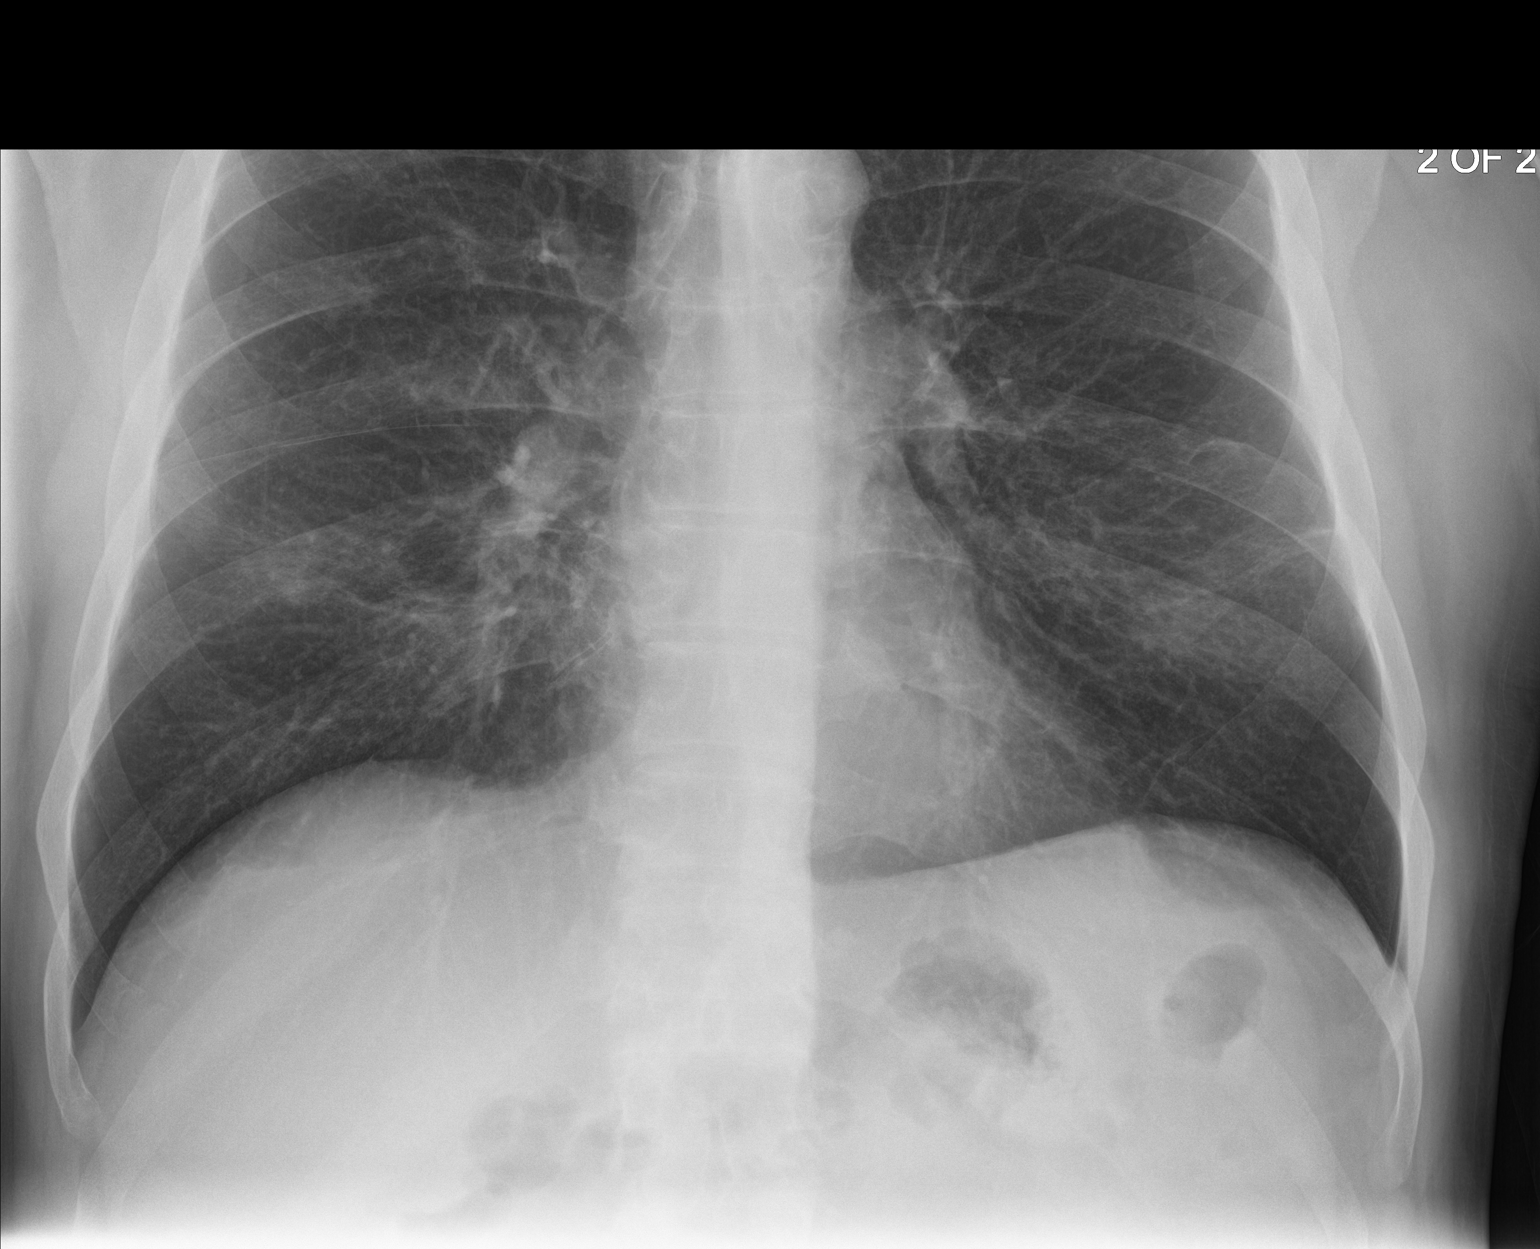

[chest lat]
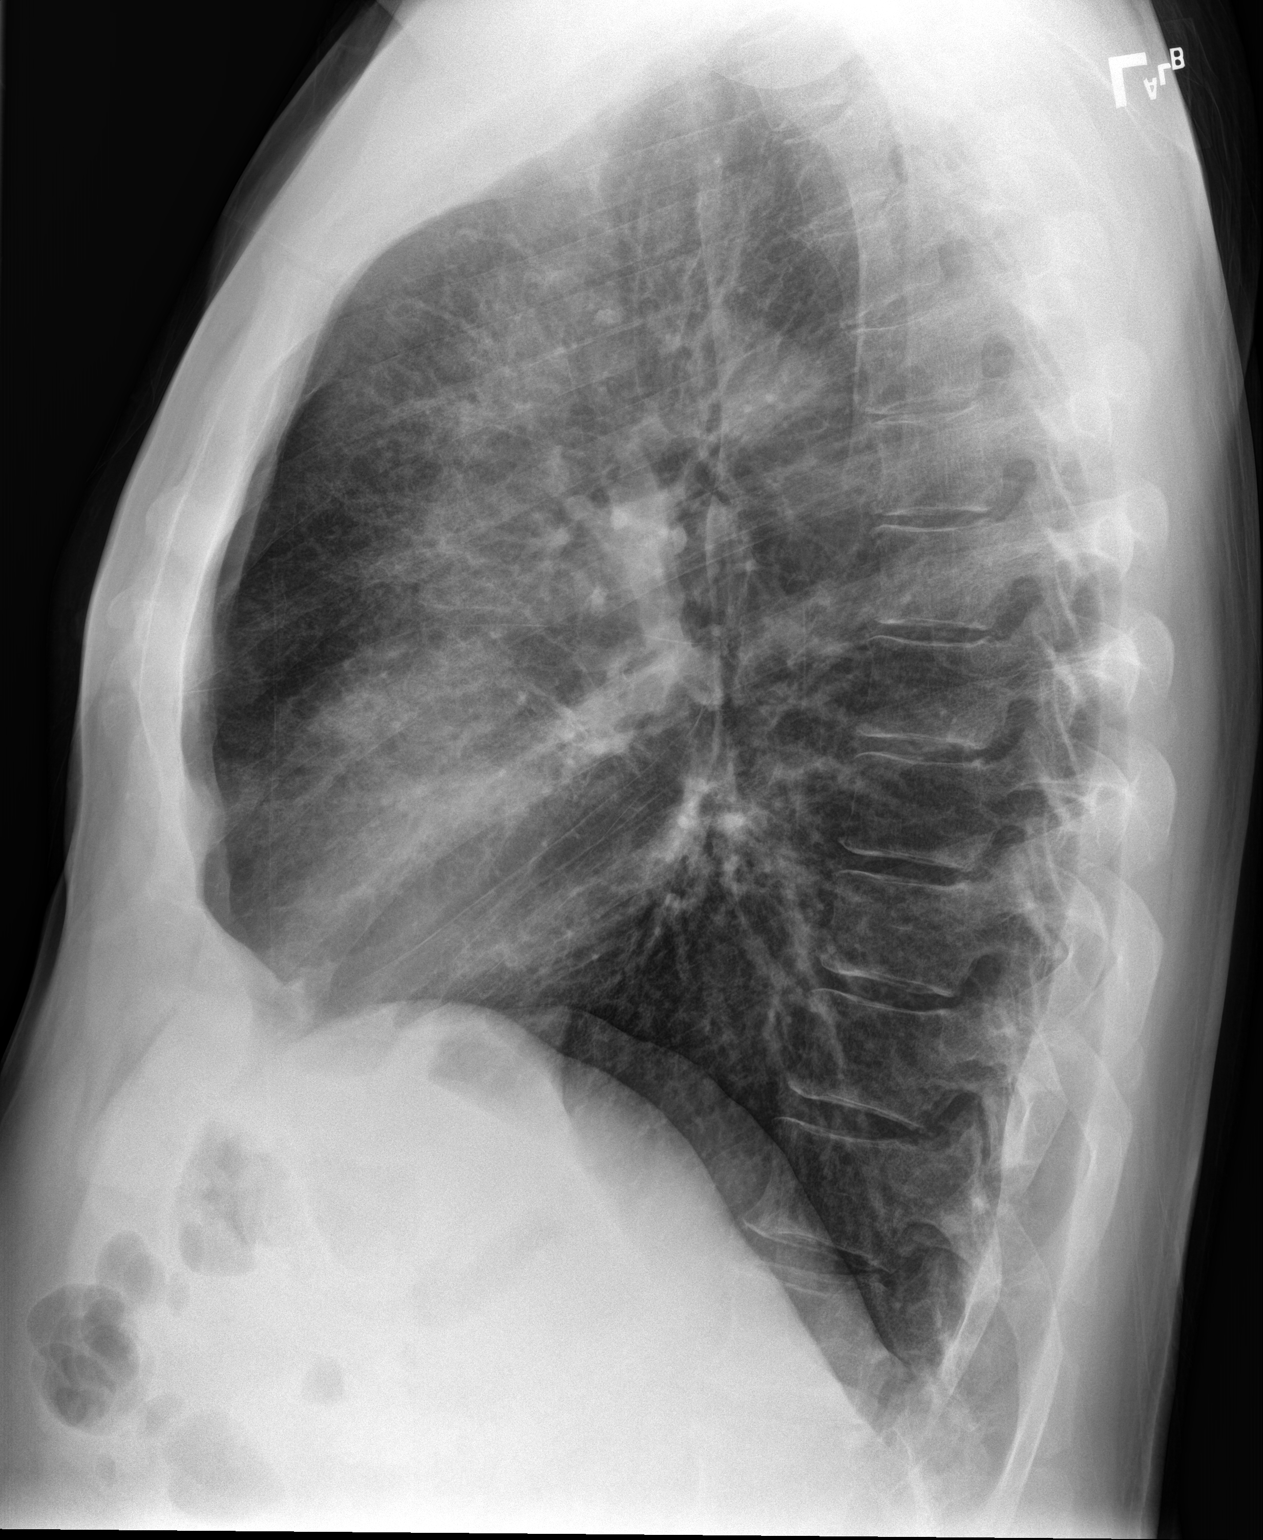

[3 of 3 positions shown; findings below may reference images not displayed]

FINDINGS: Right middle lobe hazy airspace disease concerning for pneumonia.
Mild linear lingular airspace disease likely reflecting atelectasis.
No pleural effusion or pneumothorax. Stable cardiomediastinal
silhouette. No acute osseous abnormality.
IMPRESSION: 1. Hazy right middle lobe airspace disease concerning for pneumonia.
Followup PA and lateral chest X-ray is recommended in 3-4 weeks
following trial of antibiotic therapy to ensure resolution and
exclude underlying malignancy.

## 2020-04-26 ENCOUNTER — Encounter: Payer: Self-pay | Admitting: Internal Medicine

## 2020-04-26 ENCOUNTER — Other Ambulatory Visit: Payer: Self-pay

## 2020-04-26 ENCOUNTER — Ambulatory Visit (INDEPENDENT_AMBULATORY_CARE_PROVIDER_SITE_OTHER): Payer: Managed Care, Other (non HMO) | Admitting: Internal Medicine

## 2020-04-26 VITALS — BP 126/72 | HR 70 | Temp 97.6°F | Ht 74.0 in | Wt 229.0 lb

## 2020-04-26 DIAGNOSIS — Z0000 Encounter for general adult medical examination without abnormal findings: Secondary | ICD-10-CM | POA: Diagnosis not present

## 2020-04-26 DIAGNOSIS — E785 Hyperlipidemia, unspecified: Secondary | ICD-10-CM | POA: Diagnosis not present

## 2020-04-26 DIAGNOSIS — R7303 Prediabetes: Secondary | ICD-10-CM

## 2020-04-26 DIAGNOSIS — Z125 Encounter for screening for malignant neoplasm of prostate: Secondary | ICD-10-CM

## 2020-04-26 LAB — POCT URINALYSIS DIPSTICK
Bilirubin, UA: NEGATIVE
Blood, UA: NEGATIVE
Glucose, UA: NEGATIVE
Ketones, UA: NEGATIVE
Leukocytes, UA: NEGATIVE
Nitrite, UA: NEGATIVE
Protein, UA: NEGATIVE
Spec Grav, UA: 1.02 (ref 1.010–1.025)
Urobilinogen, UA: 0.2 E.U./dL
pH, UA: 6 (ref 5.0–8.0)

## 2020-04-26 NOTE — Progress Notes (Signed)
Date:  04/26/2020   Name:  Daryl Cochran   DOB:  January 29, 1961   MRN:  [id number]   Chief Complaint: Annual Exam Daryl Cochran is a 60 y.o. male who presents today for his Complete Annual Exam. He feels well. He reports exercising none. He reports he is sleeping well. He is working at [employer]. Colonoscopy: 12/2015   There is no immunization history on file for this patient.   Hypertension This is a chronic problem. The problem is controlled. Pertinent negatives include no chest pain, headaches, palpitations or shortness of breath.  Hyperlipidemia This is a chronic problem. The problem is uncontrolled. Pertinent negatives include no chest pain, focal weakness, myalgias or shortness of breath.  Diabetes He presents for his follow-up diabetic visit. Diabetes type: prediabetes. His disease course has been stable. Pertinent negatives for hypoglycemia include no dizziness or headaches. Pertinent negatives for diabetes include no chest pain and no fatigue.    Lab Results  Component Value Date   CREATININE 0.95 01/26/2019   BUN 19 01/26/2019   NA 142 01/26/2019   K 4.8 01/26/2019   CL 105 01/26/2019   CO2 20 01/26/2019   Lab Results  Component Value Date   CHOL 206 (H) 01/26/2019   HDL 33 (L) 01/26/2019   LDLCALC 146 (H) 01/26/2019   TRIG 147 01/26/2019   CHOLHDL 6.2 (H) 01/26/2019   Lab Results  Component Value Date   TSH 3.315 11/30/2017   Lab Results  Component Value Date   HGBA1C 6.0 (H) 01/26/2019   Lab Results  Component Value Date   WBC 11.6 (H) 01/26/2019   HGB 16.2 01/26/2019   HCT 48.5 01/26/2019   MCV 86 01/26/2019   PLT 267 01/26/2019   Lab Results  Component Value Date   ALT 17 01/26/2019   AST 12 01/26/2019   ALKPHOS 66 01/26/2019   BILITOT 0.4 01/26/2019     Review of Systems  Constitutional: Negative for appetite change, chills, diaphoresis, fatigue and unexpected weight change.  HENT: Negative for hearing loss, tinnitus, trouble  swallowing and voice change.   Eyes: Negative for visual disturbance.  Respiratory: Negative for choking, shortness of breath and wheezing.   Cardiovascular: Negative for chest pain, palpitations and leg swelling.  Gastrointestinal: Negative for abdominal pain, blood in stool, constipation and diarrhea.  Genitourinary: Positive for frequency. Negative for difficulty urinating, dysuria, hematuria and urgency.  Musculoskeletal: Negative for arthralgias, back pain and myalgias.  Skin: Negative for color change and rash.  Neurological: Negative for dizziness, focal weakness, syncope and headaches.  Hematological: Negative for adenopathy.  Psychiatric/Behavioral: Negative for dysphoric mood and sleep disturbance.    Patient Active Problem List   Diagnosis Date Noted  . OA (osteoarthritis) of ankle 01/26/2019  . Benign prostatic hyperplasia with urinary frequency 11/30/2017  . Adenomatous colon polyp   . Dyslipidemia 10/30/2014  . Prediabetes 10/30/2014    No Known Allergies  Past Surgical History:  Procedure Laterality Date  . COLONOSCOPY WITH PROPOFOL N/A 01/03/2016   Procedure: COLONOSCOPY WITH PROPOFOL;  Surgeon: [name], MD;  Location: [place];  Service: Endoscopy;  Laterality: N/A;  Diabetic - oral meds  . FEMUR FRACTURE SURGERY Left    rod in leg "from hip to knee"  . HERNIA REPAIR     as child  . [place] SURGERY  1995  . POLYPECTOMY  01/03/2016   Procedure: POLYPECTOMY;  Surgeon: [name], MD;  Location: [place];  Service: Endoscopy;;  .  TONSILLECTOMY      Social History   Tobacco Use  . Smoking status: Never Smoker  . Smokeless tobacco: Never Used  Substance Use Topics  . Alcohol use: Yes    Alcohol/week: 4.0 standard drinks    Types: 4 Standard drinks or equivalent per week  . Drug use: Never     Medication list has been reviewed and updated.  No outpatient medications have been marked as taking for the 04/26/20 encounter  (Office Visit) with Glean Hess, MD.    [facility] 2/9 Scores 04/26/2020 04/18/2019 01/26/2019 05/20/2018  PHQ - 2 Score 0 0 0 0  PHQ- 9 Score 0 - 0 -    GAD 7 : Generalized Anxiety Score 04/26/2020  Nervous, Anxious, on Edge 0  Control/stop worrying 0  Worry too much - different things 0  Trouble relaxing 0  Restless 0  Easily annoyed or irritable 0  Afraid - awful might happen 0  Total GAD 7 Score 0    BP Readings from Last 3 Encounters:  04/26/20 126/72  04/18/19 128/80  01/26/19 106/70    Physical Exam Vitals and nursing note reviewed.  Constitutional:      Appearance: Normal appearance. He is well-developed.  HENT:     Head: Normocephalic.     Right Ear: Tympanic membrane, ear canal and external ear normal.     Left Ear: Tympanic membrane, ear canal and external ear normal.     Nose: Nose normal.  Eyes:     Conjunctiva/sclera: Conjunctivae normal.     Pupils: Pupils are equal, round, and reactive to light.  Neck:     Thyroid: No thyromegaly.     Vascular: No carotid bruit.  Cardiovascular:     Rate and Rhythm: Normal rate and regular rhythm.     Heart sounds: Normal heart sounds.  Pulmonary:     Effort: Pulmonary effort is normal.     Breath sounds: Normal breath sounds. No wheezing.  Chest:  Breasts:     Right: No mass.     Left: No mass.    Abdominal:     General: Bowel sounds are normal.     Palpations: Abdomen is soft.     Tenderness: There is no abdominal tenderness.  Musculoskeletal:        General: Normal range of motion.     Cervical back: Normal range of motion and neck supple.  Lymphadenopathy:     Cervical: No cervical adenopathy.  Skin:    General: Skin is warm and dry.  Neurological:     Mental Status: He is alert and oriented to person, place, and time.     Deep Tendon Reflexes: Reflexes are normal and symmetric.  Psychiatric:        Attention and Perception: Attention normal.        Mood and Affect: Mood normal.        Thought Content:  Thought content normal.     Wt Readings from Last 3 Encounters:  04/26/20 229 lb (103.9 kg)  04/18/19 218 lb (98.9 kg)  01/26/19 208 lb (94.3 kg)    BP 126/72   Pulse 70   Temp 97.6 F (36.4 C) (Oral)   Ht 6\' 2"  (1.88 m)   Wt 229 lb (103.9 kg)   SpO2 95%   BMI 29.40 kg/m   Assessment and Plan: 1. Annual physical exam Normal exam; work on diet and weight loss - POCT urinalysis dipstick  2. Prostate cancer screening DRE deferred -  PSA  3. Dyslipidemia Not currently taking any statin therapy Will check labs and advise - Lipid panel  4. Prediabetes Needs to work on diet and weight loss to avoid progression to DM - CBC with Differential/Platelet - Comprehensive metabolic panel - Hemoglobin A1c   Partially dictated using [employer]. Any errors are unintentional.  Halina Maidens, MD [facility]  04/26/2020

## 2020-04-27 ENCOUNTER — Other Ambulatory Visit: Payer: Self-pay | Admitting: Internal Medicine

## 2020-04-27 DIAGNOSIS — E785 Hyperlipidemia, unspecified: Secondary | ICD-10-CM

## 2020-04-27 LAB — COMPREHENSIVE METABOLIC PANEL
ALT: 25 IU/L (ref 0–44)
AST: 16 IU/L (ref 0–40)
Albumin/Globulin Ratio: 2 (ref 1.2–2.2)
Albumin: 4.7 g/dL (ref 3.8–4.9)
Alkaline Phosphatase: 72 IU/L (ref 44–121)
BUN/Creatinine Ratio: 13 (ref 9–20)
BUN: 15 mg/dL (ref 6–24)
Bilirubin Total: 0.4 mg/dL (ref 0.0–1.2)
CO2: 20 mmol/L (ref 20–29)
Calcium: 9.5 mg/dL (ref 8.7–10.2)
Chloride: 105 mmol/L (ref 96–106)
Creatinine, Ser: 1.18 mg/dL (ref 0.76–1.27)
GFR calc Af Amer: 78 mL/min/{1.73_m2} (ref >59)
GFR calc non Af Amer: 67 mL/min/{1.73_m2} (ref >59)
Globulin, Total: 2.3 g/dL (ref 1.5–4.5)
Glucose: 99 mg/dL (ref 65–99)
Potassium: 4.5 mmol/L (ref 3.5–5.2)
Sodium: 141 mmol/L (ref 134–144)
Total Protein: 7 g/dL (ref 6.0–8.5)

## 2020-04-27 LAB — CBC WITH DIFFERENTIAL/PLATELET
Basophils Absolute: 0.1 10*3/uL (ref 0.0–0.2)
Basos: 1 %
EOS (ABSOLUTE): 0.3 10*3/uL (ref 0.0–0.4)
Eos: 3 %
Hematocrit: 47 % (ref 37.5–51.0)
Hemoglobin: 16 g/dL (ref 13.0–17.7)
Immature Grans (Abs): 0.1 10*3/uL (ref 0.0–0.1)
Immature Granulocytes: 1 %
Lymphocytes Absolute: 2.9 10*3/uL (ref 0.7–3.1)
Lymphs: 29 %
MCH: 29.6 pg (ref 26.6–33.0)
MCHC: 34 g/dL (ref 31.5–35.7)
MCV: 87 fL (ref 79–97)
Monocytes Absolute: 0.9 10*3/uL (ref 0.1–0.9)
Monocytes: 9 %
Neutrophils Absolute: 5.8 10*3/uL (ref 1.4–7.0)
Neutrophils: 57 %
Platelets: 271 10*3/uL (ref 150–450)
RBC: 5.41 x10E6/uL (ref 4.14–5.80)
RDW: 13.6 % (ref 11.6–15.4)
WBC: 10.1 10*3/uL (ref 3.4–10.8)

## 2020-04-27 LAB — LIPID PANEL
Chol/HDL Ratio: 6.7 ratio — ABNORMAL HIGH (ref 0.0–5.0)
Cholesterol, Total: 209 mg/dL — ABNORMAL HIGH (ref 100–199)
HDL: 31 mg/dL — ABNORMAL LOW (ref >39)
LDL Chol Calc (NIH): 141 mg/dL — ABNORMAL HIGH (ref 0–99)
Triglycerides: 203 mg/dL — ABNORMAL HIGH (ref 0–149)
VLDL Cholesterol Cal: 37 mg/dL (ref 5–40)

## 2020-04-27 LAB — PSA: Prostate Specific Ag, Serum: 1.5 ng/mL (ref 0.0–4.0)

## 2020-04-27 LAB — HEMOGLOBIN A1C
Est. average glucose Bld gHb Est-mCnc: 137 mg/dL
Hgb A1c MFr Bld: 6.4 % — ABNORMAL HIGH (ref 4.8–5.6)

## 2020-04-27 MED ORDER — ATORVASTATIN CALCIUM 10 MG PO TABS: 10.0000 mg | ORAL_TABLET | Freq: Every day | ORAL | 1 refills | Status: DC

## 2020-05-03 NOTE — Progress Notes (Signed)
Called pt phone is not in service.  Will route result note to [facility] Nurse Triage for follow up when patient returns call to clinic. Nurse may give results to patient if they return call. CRM created for this message.   KP

## 2020-05-07 ENCOUNTER — Telehealth: Payer: Self-pay

## 2020-05-07 NOTE — Telephone Encounter (Signed)
Called pt with lab results and scheduled f/u appt.  KP

## 2020-05-07 NOTE — Telephone Encounter (Unsigned)
Copied from [place] [id number]. Topic: General - Other >> May 07, 2020  9:53 AM Yvette Rack wrote: Reason for CRM: Pt stated he has not heard back from anyone since having his physical and he would like an update on the lab results. Pt requests call back. Cb# [phone]

## 2020-10-25 ENCOUNTER — Other Ambulatory Visit: Payer: Self-pay

## 2020-10-25 ENCOUNTER — Ambulatory Visit (INDEPENDENT_AMBULATORY_CARE_PROVIDER_SITE_OTHER): Payer: Managed Care, Other (non HMO) | Admitting: Internal Medicine

## 2020-10-25 ENCOUNTER — Encounter: Payer: Self-pay | Admitting: Internal Medicine

## 2020-10-25 VITALS — BP 112/60 | HR 71 | Ht 74.0 in | Wt 218.2 lb

## 2020-10-25 DIAGNOSIS — D125 Benign neoplasm of sigmoid colon: Secondary | ICD-10-CM

## 2020-10-25 DIAGNOSIS — E785 Hyperlipidemia, unspecified: Secondary | ICD-10-CM

## 2020-10-25 DIAGNOSIS — R350 Frequency of micturition: Secondary | ICD-10-CM

## 2020-10-25 DIAGNOSIS — R7303 Prediabetes: Secondary | ICD-10-CM

## 2020-10-25 MED ORDER — ATORVASTATIN CALCIUM 10 MG PO TABS: 10.0000 mg | ORAL_TABLET | Freq: Every day | ORAL | 1 refills | Status: DC

## 2020-10-25 NOTE — Progress Notes (Signed)
Date:  10/25/2020   Name:  Daryl Cochran   DOB:  1960-04-21   MRN:  [id number]   Chief Complaint: Hyperlipidemia and Prediabetes Due for colonoscopy with Dr. Allen Norris.  Hyperlipidemia This is a chronic problem. The problem is uncontrolled. Pertinent negatives include no chest pain or shortness of breath. Treatments tried: statin ordered last visit but never taken.  Diabetes He presents for his follow-up diabetic visit. Diabetes type: prediabetes. His disease course has been stable. Pertinent negatives for hypoglycemia include no headaches or tremors. Pertinent negatives for diabetes include no chest pain, no fatigue, no polydipsia and no polyuria. Risk factors for coronary artery disease include dyslipidemia. Current diabetic treatment includes diet. He is compliant with treatment most of the time.   Lab Results  Component Value Date   CREATININE 1.18 04/26/2020   BUN 15 04/26/2020   NA 141 04/26/2020   K 4.5 04/26/2020   CL 105 04/26/2020   CO2 20 04/26/2020   Lab Results  Component Value Date   CHOL 209 (H) 04/26/2020   HDL 31 (L) 04/26/2020   LDLCALC 141 (H) 04/26/2020   TRIG 203 (H) 04/26/2020   CHOLHDL 6.7 (H) 04/26/2020   Lab Results  Component Value Date   TSH 3.315 11/30/2017   Lab Results  Component Value Date   HGBA1C 6.4 (H) 04/26/2020   Lab Results  Component Value Date   WBC 10.1 04/26/2020   HGB 16.0 04/26/2020   HCT 47.0 04/26/2020   MCV 87 04/26/2020   PLT 271 04/26/2020   Lab Results  Component Value Date   ALT 25 04/26/2020   AST 16 04/26/2020   ALKPHOS 72 04/26/2020   BILITOT 0.4 04/26/2020     Review of Systems  Constitutional:  Negative for appetite change, fatigue and unexpected weight change.  Eyes:  Negative for visual disturbance.  Respiratory:  Negative for cough, shortness of breath and wheezing.   Cardiovascular:  Negative for chest pain, palpitations and leg swelling.  Gastrointestinal:  Negative for abdominal pain and blood  in stool.  Endocrine: Negative for polydipsia and polyuria.  Genitourinary:  Positive for urgency. Negative for dysuria and hematuria.  Skin:  Negative for color change and rash.  Neurological:  Negative for tremors, numbness and headaches.  Psychiatric/Behavioral:  Negative for dysphoric mood.    Patient Active Problem List   Diagnosis Date Noted   OA (osteoarthritis) of ankle 01/26/2019   Benign prostatic hyperplasia with urinary frequency 11/30/2017   Adenomatous colon polyp    Dyslipidemia 10/30/2014   Prediabetes 10/30/2014    No Known Allergies  Past Surgical History:  Procedure Laterality Date   COLONOSCOPY WITH PROPOFOL N/A 01/03/2016   Procedure: COLONOSCOPY WITH PROPOFOL;  Surgeon: [name], MD;  Location: [facility];  Service: Endoscopy;  Laterality: N/A;  Diabetic - oral meds   FEMUR FRACTURE SURGERY Left    rod in leg "from hip to knee"   HERNIA REPAIR     as child   [place]   POLYPECTOMY  01/03/2016   Procedure: POLYPECTOMY;  Surgeon: [name], MD;  Location: [facility];  Service: Endoscopy;;   TONSILLECTOMY      Social History   Tobacco Use   Smoking status: Never   Smokeless tobacco: Never  Substance Use Topics   Alcohol use: Yes    Alcohol/week: 4.0 standard drinks    Types: 4 Standard drinks or equivalent per week   Drug use: Never  Medication list has been reviewed and updated.  No outpatient medications have been marked as taking for the 10/25/20 encounter (Office Visit) with Glean Hess, MD.    [facility] 2/9 Scores 10/25/2020 04/26/2020 04/18/2019 01/26/2019  PHQ - 2 Score 0 0 0 0  PHQ- 9 Score 0 0 - 0    GAD 7 : Generalized Anxiety Score 10/25/2020 04/26/2020  Nervous, Anxious, on Edge 0 0  Control/stop worrying 0 0  Worry too much - different things 0 0  Trouble relaxing 0 0  Restless 0 0  Easily annoyed or irritable 0 0  Afraid - awful might happen 0 0  Total GAD 7 Score 0 0  Anxiety Difficulty  Not difficult at all -    BP Readings from Last 3 Encounters:  10/25/20 112/60  04/26/20 126/72  04/18/19 128/80    Physical Exam Vitals and nursing note reviewed.  Constitutional:      General: He is not in acute distress.    Appearance: He is well-developed.  HENT:     Head: Normocephalic and atraumatic.  Neck:     Vascular: No carotid bruit.  Cardiovascular:     Rate and Rhythm: Normal rate and regular rhythm.     Pulses: Normal pulses.     Heart sounds: No murmur heard. Pulmonary:     Effort: Pulmonary effort is normal. No respiratory distress.  Musculoskeletal:     Cervical back: Normal range of motion.     Right lower leg: No edema.     Left lower leg: No edema.  Lymphadenopathy:     Cervical: No cervical adenopathy.  Skin:    General: Skin is warm and dry.     Findings: No rash.  Neurological:     Mental Status: He is alert and oriented to person, place, and time.  Psychiatric:        Mood and Affect: Mood normal.        Behavior: Behavior normal.    Wt Readings from Last 3 Encounters:  10/25/20 218 lb 3.2 oz (99 kg)  04/26/20 229 lb (103.9 kg)  04/18/19 218 lb (98.9 kg)    BP 112/60 (BP Location: Right Arm, Patient Position: Sitting, Cuff Size: Large)   Pulse 71   Ht 6\' 2"  (1.88 m)   Wt 218 lb 3.2 oz (99 kg)   SpO2 96%   BMI 28.02 kg/m   Assessment and Plan: 1. Dyslipidemia He never started Lipitor prescribed previously.   His 10 yr risk is 21% so he agrees to take medication - Lipid panel - atorvastatin (LIPITOR) 10 MG tablet; Take 1 tablet (10 mg total) by mouth daily.  Dispense: 90 tablet; Refill: 1  2. Prediabetes Continue diet and weight loss - Hemoglobin A1c  3. Adenomatous polyp of sigmoid colon Due for 5 yr colonoscopy with Dr. Allen Norris  4. Urinary frequency With hx of mild BPH Recommend avoiding postponing urination and allowing additional time to empty completely Follow up if worsening  Partially dictated using [place].  Any errors are unintentional.  Halina Maidens, MD [facility]  10/25/2020

## 2020-10-26 LAB — HEMOGLOBIN A1C
Est. average glucose Bld gHb Est-mCnc: 137 mg/dL
Hgb A1c MFr Bld: 6.4 % — ABNORMAL HIGH (ref 4.8–5.6)

## 2020-10-26 LAB — LIPID PANEL
Chol/HDL Ratio: 5.9 ratio — ABNORMAL HIGH (ref 0.0–5.0)
Cholesterol, Total: 176 mg/dL (ref 100–199)
HDL: 30 mg/dL — ABNORMAL LOW (ref >39)
LDL Chol Calc (NIH): 126 mg/dL — ABNORMAL HIGH (ref 0–99)
Triglycerides: 107 mg/dL (ref 0–149)
VLDL Cholesterol Cal: 20 mg/dL (ref 5–40)

## 2021-04-29 ENCOUNTER — Other Ambulatory Visit: Payer: Self-pay

## 2021-04-29 ENCOUNTER — Encounter: Payer: Self-pay | Admitting: Internal Medicine

## 2021-04-29 ENCOUNTER — Ambulatory Visit (INDEPENDENT_AMBULATORY_CARE_PROVIDER_SITE_OTHER): Payer: Managed Care, Other (non HMO) | Admitting: Internal Medicine

## 2021-04-29 VITALS — BP 102/74 | HR 68 | Ht 74.0 in | Wt 215.8 lb

## 2021-04-29 DIAGNOSIS — Z125 Encounter for screening for malignant neoplasm of prostate: Secondary | ICD-10-CM | POA: Diagnosis not present

## 2021-04-29 DIAGNOSIS — R7303 Prediabetes: Secondary | ICD-10-CM

## 2021-04-29 DIAGNOSIS — D125 Benign neoplasm of sigmoid colon: Secondary | ICD-10-CM

## 2021-04-29 DIAGNOSIS — E785 Hyperlipidemia, unspecified: Secondary | ICD-10-CM

## 2021-04-29 DIAGNOSIS — Z0000 Encounter for general adult medical examination without abnormal findings: Secondary | ICD-10-CM

## 2021-04-29 DIAGNOSIS — Z86010 Personal history of colonic polyps: Secondary | ICD-10-CM

## 2021-04-29 MED ORDER — NA SULFATE-K SULFATE-MG SULF 17.5-3.13-1.6 GM/177ML PO SOLN
1.0000 | Freq: Once | ORAL | 0 refills | Status: AC
Start: 2021-04-29 — End: 2021-04-29

## 2021-04-29 NOTE — Progress Notes (Signed)
Date:  04/29/2021   Name:  Daryl Cochran   DOB:  10-27-1960   MRN:  [id number]   Chief Complaint: Annual Exam Daryl Cochran is a 61 y.o. male who presents today for his Complete Annual Exam. He feels well. He reports exercising - none. He reports he is sleeping fairly well.   Colonoscopy: 12/2015 repeat 5 yrs for TA   There is no immunization history on file for this patient.  Lab Results  Component Value Date   PSA1 1.5 04/26/2020   PSA1 1.2 01/26/2019   PSA1 1.1 09/17/2015   PSA 0.9 10/29/2012    Hyperlipidemia This is a chronic problem. The problem is controlled. Pertinent negatives include no chest pain, myalgias or shortness of breath. Current antihyperlipidemic treatment includes statins. Compliance problems include medication side effects (constipation).    Lab Results  Component Value Date   NA 141 04/26/2020   K 4.5 04/26/2020   CO2 20 04/26/2020   GLUCOSE 99 04/26/2020   BUN 15 04/26/2020   CREATININE 1.18 04/26/2020   CALCIUM 9.5 04/26/2020   GFRNONAA 67 04/26/2020   Lab Results  Component Value Date   CHOL 176 10/25/2020   HDL 30 (L) 10/25/2020   LDLCALC 126 (H) 10/25/2020   TRIG 107 10/25/2020   CHOLHDL 5.9 (H) 10/25/2020   Lab Results  Component Value Date   TSH 3.315 11/30/2017   Lab Results  Component Value Date   HGBA1C 6.4 (H) 10/25/2020   Lab Results  Component Value Date   WBC 10.1 04/26/2020   HGB 16.0 04/26/2020   HCT 47.0 04/26/2020   MCV 87 04/26/2020   PLT 271 04/26/2020   Lab Results  Component Value Date   ALT 25 04/26/2020   AST 16 04/26/2020   ALKPHOS 72 04/26/2020   BILITOT 0.4 04/26/2020   No results found for: 25OHVITD2, 25OHVITD3, VD25OH   Review of Systems  Constitutional:  Negative for appetite change, chills, diaphoresis, fatigue and unexpected weight change.  HENT:  Negative for hearing loss, tinnitus, trouble swallowing and voice change.   Eyes:  Negative for visual disturbance.  Respiratory:   Negative for choking, shortness of breath and wheezing.   Cardiovascular:  Negative for chest pain, palpitations and leg swelling.  Gastrointestinal:  Negative for abdominal pain, blood in stool, constipation and diarrhea.  Genitourinary:  Negative for difficulty urinating, dysuria and frequency.  Musculoskeletal:  Negative for arthralgias, back pain and myalgias.  Skin:  Negative for color change and rash.  Neurological:  Negative for dizziness, syncope and headaches.  Hematological:  Negative for adenopathy.  Psychiatric/Behavioral:  Negative for dysphoric mood and sleep disturbance. The patient is not nervous/anxious.    Patient Active Problem List   Diagnosis Date Noted   OA (osteoarthritis) of ankle 01/26/2019   Benign prostatic hyperplasia with urinary frequency 11/30/2017   Adenomatous colon polyp    Dyslipidemia 10/30/2014   Prediabetes 10/30/2014    No Known Allergies  Past Surgical History:  Procedure Laterality Date   COLONOSCOPY WITH PROPOFOL N/A 01/03/2016   Procedure: COLONOSCOPY WITH PROPOFOL;  Surgeon: [name], MD;  Location: [place];  Service: Endoscopy;  Laterality: N/A;  Diabetic - oral meds   FEMUR FRACTURE SURGERY Left    rod in leg "from hip to knee"   HERNIA REPAIR     as child   [place]   POLYPECTOMY  01/03/2016   Procedure: POLYPECTOMY;  Surgeon: [name], MD;  Location:  [facility];  Service: Endoscopy;;   TONSILLECTOMY      Social History   Tobacco Use   Smoking status: Never   Smokeless tobacco: Never  Substance Use Topics   Alcohol use: Yes    Alcohol/week: 4.0 standard drinks    Types: 4 Standard drinks or equivalent per week   Drug use: Never     Medication list has been reviewed and updated.  No outpatient medications have been marked as taking for the 04/29/21 encounter (Office Visit) with Glean Hess, MD.    [facility] 2/9 Scores 04/29/2021 10/25/2020 04/26/2020 04/18/2019  PHQ - 2 Score 0 0 0  0  PHQ- 9 Score 0 0 0 -    GAD 7 : Generalized Anxiety Score 04/29/2021 10/25/2020 04/26/2020  Nervous, Anxious, on Edge 0 0 0  Control/stop worrying 0 0 0  Worry too much - different things 0 0 0  Trouble relaxing 0 0 0  Restless 0 0 0  Easily annoyed or irritable 0 0 0  Afraid - awful might happen 0 0 0  Total GAD 7 Score 0 0 0  Anxiety Difficulty Not difficult at all Not difficult at all -    BP Readings from Last 3 Encounters:  04/29/21 102/74  10/25/20 112/60  04/26/20 126/72    Physical Exam Vitals and nursing note reviewed.  Constitutional:      Appearance: Normal appearance. He is well-developed.  HENT:     Head: Normocephalic.     Right Ear: Tympanic membrane, ear canal and external ear normal.     Left Ear: Tympanic membrane, ear canal and external ear normal.     Nose: Nose normal.  Eyes:     Conjunctiva/sclera: Conjunctivae normal.     Pupils: Pupils are equal, round, and reactive to light.  Neck:     Thyroid: No thyromegaly.     Vascular: No carotid bruit.  Cardiovascular:     Rate and Rhythm: Normal rate and regular rhythm.     Heart sounds: Normal heart sounds.  Pulmonary:     Effort: Pulmonary effort is normal.     Breath sounds: Normal breath sounds. No wheezing.  Chest:  Breasts:    Right: No mass.     Left: No mass.  Abdominal:     General: Bowel sounds are normal.     Palpations: Abdomen is soft.     Tenderness: There is no abdominal tenderness.  Musculoskeletal:        General: Normal range of motion.     Cervical back: Normal range of motion and neck supple.     Right lower leg: No edema.     Left lower leg: No edema.  Lymphadenopathy:     Cervical: No cervical adenopathy.  Skin:    General: Skin is warm and dry.     Capillary Refill: Capillary refill takes less than 2 seconds.  Neurological:     General: No focal deficit present.     Mental Status: He is alert and oriented to person, place, and time.     Deep Tendon Reflexes:  Reflexes are normal and symmetric.  Psychiatric:        Attention and Perception: Attention normal.        Mood and Affect: Mood normal.        Thought Content: Thought content normal.    Wt Readings from Last 3 Encounters:  04/29/21 215 lb 12.8 oz (97.9 kg)  10/25/20 218 lb 3.2 oz (99  kg)  04/26/20 229 lb (103.9 kg)    BP 102/74    Pulse 68    Ht 6\' 2"  (1.88 m)    Wt 215 lb 12.8 oz (97.9 kg)    SpO2 97%    BMI 27.71 kg/m   Assessment and Plan: 1. Annual physical exam Normal exam. He declines all vaccinations. Continue healthy diet and regular exercise. - CBC with Differential/Platelet  2. Prostate cancer screening DRE deferred - PSA  3. Adenomatous polyp of sigmoid colon Due now for 31yr follow up. - Ambulatory referral to Gastroenterology  4. Dyslipidemia Not taking Lipitor due to constipation. He has lost some weight with diet changes so will recheck then advise. - Comprehensive metabolic panel - Lipid panel  5. Prediabetes Should be improved with weight loss. - Hemoglobin A1c   Partially dictated using Editor, commissioning. Any errors are unintentional.  Halina Maidens, MD [facility]  04/29/2021

## 2021-04-29 NOTE — Progress Notes (Signed)
Gastroenterology Pre-Procedure Review  Request Date: 05/13/2021 Requesting Physician: Dr. Allen Norris  PATIENT REVIEW QUESTIONS: The patient responded to the following health history questions as indicated:  5 year Recall  1. Are you having any GI issues? no 2. Do you have a personal history of Polyps? yes (12/2015) 3. Do you have a family history of Colon Cancer or Polyps? no 4. Diabetes Mellitus? no 5. Joint replacements in the past 12 months?no 6. Major health problems in the past 3 months?no 7. Any artificial heart valves, MVP, or defibrillator?no    MEDICATIONS & ALLERGIES:    Patient reports the following regarding taking any anticoagulation/antiplatelet therapy:   Plavix, Coumadin, Eliquis, Xarelto, Lovenox, Pradaxa, Brilinta, or Effient? no Aspirin? no  Patient confirms/reports the following medications:  No current outpatient medications on file.   No current facility-administered medications for this visit.    Patient confirms/reports the following allergies:  Allergies  Allergen Reactions   Atorvastatin Other (See Comments)    Constipation    No orders of the defined types were placed in this encounter.   AUTHORIZATION INFORMATION Primary Insurance: 1D#: Group #:  Secondary Insurance: 1D#: Group #:  SCHEDULE INFORMATION: Date: 05/13/2021 Time: Location: [place]

## 2021-04-30 ENCOUNTER — Encounter: Payer: Self-pay | Admitting: Gastroenterology

## 2021-04-30 LAB — CBC WITH DIFFERENTIAL/PLATELET
Basophils Absolute: 0.1 10*3/uL (ref 0.0–0.2)
Basos: 1 %
EOS (ABSOLUTE): 0.2 10*3/uL (ref 0.0–0.4)
Eos: 2 %
Hematocrit: 49.3 % (ref 37.5–51.0)
Hemoglobin: 16.6 g/dL (ref 13.0–17.7)
Immature Grans (Abs): 0.1 10*3/uL (ref 0.0–0.1)
Immature Granulocytes: 1 %
Lymphocytes Absolute: 2.5 10*3/uL (ref 0.7–3.1)
Lymphs: 25 %
MCH: 29.7 pg (ref 26.6–33.0)
MCHC: 33.7 g/dL (ref 31.5–35.7)
MCV: 88 fL (ref 79–97)
Monocytes Absolute: 0.7 10*3/uL (ref 0.1–0.9)
Monocytes: 6 %
Neutrophils Absolute: 6.7 10*3/uL (ref 1.4–7.0)
Neutrophils: 65 %
Platelets: 301 10*3/uL (ref 150–450)
RBC: 5.59 x10E6/uL (ref 4.14–5.80)
RDW: 13.1 % (ref 11.6–15.4)
WBC: 10.3 10*3/uL (ref 3.4–10.8)

## 2021-04-30 LAB — LIPID PANEL
Chol/HDL Ratio: 6.9 ratio — ABNORMAL HIGH (ref 0.0–5.0)
Cholesterol, Total: 206 mg/dL — ABNORMAL HIGH (ref 100–199)
HDL: 30 mg/dL — ABNORMAL LOW (ref >39)
LDL Chol Calc (NIH): 148 mg/dL — ABNORMAL HIGH (ref 0–99)
Triglycerides: 152 mg/dL — ABNORMAL HIGH (ref 0–149)
VLDL Cholesterol Cal: 28 mg/dL (ref 5–40)

## 2021-04-30 LAB — COMPREHENSIVE METABOLIC PANEL
ALT: 18 IU/L (ref 0–44)
AST: 14 IU/L (ref 0–40)
Albumin/Globulin Ratio: 2.2 (ref 1.2–2.2)
Albumin: 4.7 g/dL (ref 3.8–4.9)
Alkaline Phosphatase: 71 IU/L (ref 44–121)
BUN/Creatinine Ratio: 17 (ref 10–24)
BUN: 19 mg/dL (ref 8–27)
Bilirubin Total: 0.3 mg/dL (ref 0.0–1.2)
CO2: 23 mmol/L (ref 20–29)
Calcium: 9.7 mg/dL (ref 8.6–10.2)
Chloride: 105 mmol/L (ref 96–106)
Creatinine, Ser: 1.1 mg/dL (ref 0.76–1.27)
Globulin, Total: 2.1 g/dL (ref 1.5–4.5)
Glucose: 131 mg/dL — ABNORMAL HIGH (ref 70–99)
Potassium: 5.1 mmol/L (ref 3.5–5.2)
Sodium: 140 mmol/L (ref 134–144)
Total Protein: 6.8 g/dL (ref 6.0–8.5)
eGFR: 77 mL/min/{1.73_m2} (ref >59)

## 2021-04-30 LAB — HEMOGLOBIN A1C
Est. average glucose Bld gHb Est-mCnc: 140 mg/dL
Hgb A1c MFr Bld: 6.5 % — ABNORMAL HIGH (ref 4.8–5.6)

## 2021-04-30 LAB — PSA: Prostate Specific Ag, Serum: 2.2 ng/mL (ref 0.0–4.0)

## 2021-05-13 ENCOUNTER — Ambulatory Visit
Admission: RE | Admit: 2021-05-13 | Discharge: 2021-05-13 | Disposition: A | Discharge status: Home / Self Care | Payer: Managed Care, Other (non HMO) | Attending: Gastroenterology | Admitting: Gastroenterology

## 2021-05-13 ENCOUNTER — Encounter: Payer: Self-pay | Admitting: Gastroenterology

## 2021-05-13 ENCOUNTER — Encounter
Admission: RE | Disposition: A | Discharge status: Home / Self Care | Payer: Self-pay | Source: Home / Self Care | Attending: Gastroenterology

## 2021-05-13 ENCOUNTER — Ambulatory Visit: Payer: Managed Care, Other (non HMO) | Admitting: Anesthesiology

## 2021-05-13 ENCOUNTER — Other Ambulatory Visit: Payer: Self-pay

## 2021-05-13 DIAGNOSIS — D124 Benign neoplasm of descending colon: Secondary | ICD-10-CM | POA: Insufficient documentation

## 2021-05-13 DIAGNOSIS — F17290 Nicotine dependence, other tobacco product, uncomplicated: Secondary | ICD-10-CM | POA: Insufficient documentation

## 2021-05-13 DIAGNOSIS — K635 Polyp of colon: Secondary | ICD-10-CM

## 2021-05-13 DIAGNOSIS — Z1211 Encounter for screening for malignant neoplasm of colon: Secondary | ICD-10-CM | POA: Diagnosis not present

## 2021-05-13 DIAGNOSIS — Z86010 Personal history of colonic polyps: Secondary | ICD-10-CM | POA: Diagnosis not present

## 2021-05-13 HISTORY — PX: COLONOSCOPY WITH PROPOFOL: SHX5780

## 2021-05-13 HISTORY — PX: POLYPECTOMY: SHX5525

## 2021-05-13 SURGERY — COLONOSCOPY WITH PROPOFOL
Anesthesia: General | Site: Rectum

## 2021-05-13 MED ORDER — LACTATED RINGERS IV SOLN: INTRAVENOUS | Status: DC

## 2021-05-13 MED ORDER — ACETAMINOPHEN 160 MG/5ML PO SOLN: 325.0000 mg | Freq: Once | ORAL | Status: DC

## 2021-05-13 MED ORDER — PROPOFOL 10 MG/ML IV BOLUS
INTRAVENOUS | Status: DC | PRN
  Administered 2021-05-13: 50 mg via INTRAVENOUS
  Administered 2021-05-13: 100 mg via INTRAVENOUS
  Administered 2021-05-13: 50 mg via INTRAVENOUS
  Administered 2021-05-13: 100 mg via INTRAVENOUS
  Administered 2021-05-13 (×3): 50 mg via INTRAVENOUS
  Administered 2021-05-13 (×2): 100 mg via INTRAVENOUS
  Administered 2021-05-13: 50 mg via INTRAVENOUS

## 2021-05-13 MED ORDER — ACETAMINOPHEN 325 MG PO TABS: 325.0000 mg | ORAL_TABLET | Freq: Once | ORAL | Status: DC

## 2021-05-13 MED ORDER — STERILE WATER FOR IRRIGATION IR SOLN
Status: DC | PRN
  Administered 2021-05-13: 250 mL

## 2021-05-13 MED ORDER — SODIUM CHLORIDE 0.9 % IV SOLN: INTRAVENOUS | Status: DC

## 2021-05-13 MED ORDER — LIDOCAINE HCL (CARDIAC) PF 100 MG/5ML IV SOSY
PREFILLED_SYRINGE | INTRAVENOUS | Status: DC | PRN
  Administered 2021-05-13: 30 mg via INTRAVENOUS

## 2021-05-13 SURGICAL SUPPLY — 9 items
FORCEPS BIOP RAD 4 LRG CAP 4 (CUTTING FORCEPS) ×1 IMPLANT
GOWN CVR UNV OPN BCK APRN NK (MISCELLANEOUS) ×4 IMPLANT
GOWN ISOL THUMB LOOP REG UNIV (MISCELLANEOUS) ×6
KIT PRC NS LF DISP ENDO (KITS) ×2 IMPLANT
KIT PROCEDURE OLYMPUS (KITS) ×3
MANIFOLD NEPTUNE II (INSTRUMENTS) ×3 IMPLANT
SNARE COLD EXACTO (MISCELLANEOUS) ×1 IMPLANT
TRAP ETRAP POLY (MISCELLANEOUS) ×1 IMPLANT
WATER STERILE IRR 250ML POUR (IV SOLUTION) ×3 IMPLANT

## 2021-05-13 NOTE — Op Note (Signed)
[facility] Gastroenterology Patient Name: Daryl Cochran Procedure Date: 05/13/2021 8:52 AM MRN: [id number] Account #: [id number] Date of Birth: 1961/03/01 Admit Type: Outpatient Age: 61 Room: [facility] OR ROOM 01 Gender: Male Note Status: Finalized Instrument Name: [id number] Procedure:             Colonoscopy Indications:           High risk colon cancer surveillance: Personal history                         of colonic polyps Providers:             [name] MD, MD Referring MD:          Halina Maidens, MD (Referring MD) Medicines:             Propofol per Anesthesia Complications:         No immediate complications. Procedure:             Pre-Anesthesia Assessment:                        - Prior to the procedure, a History and Physical was                         performed, and patient medications and allergies were                         reviewed. The patient's tolerance of previous                         anesthesia was also reviewed. The risks and benefits                         of the procedure and the sedation options and risks                         were discussed with the patient. All questions were                         answered, and informed consent was obtained. Prior                         Anticoagulants: The patient has taken no previous                         anticoagulant or antiplatelet agents. ASA Grade                         Assessment: II - A patient with mild systemic disease.                         After reviewing the risks and benefits, the patient                         was deemed in satisfactory condition to undergo the                         procedure.  After obtaining informed consent, the colonoscope was                         passed under direct vision. Throughout the procedure,                         the patient's blood pressure, pulse, and oxygen                         saturations were monitored  continuously. The was                         introduced through the anus and advanced to the the                         cecum, identified by appendiceal orifice and ileocecal                         valve. The colonoscopy was performed without                         difficulty. The patient tolerated the procedure well.                         The quality of the bowel preparation was good. Findings:      The perianal and digital rectal examinations were normal.      A 3 mm polyp was found in the sigmoid colon. The polyp was sessile. The       polyp was removed with a cold biopsy forceps. Resection and retrieval       were complete.      A 7 mm polyp was found in the sigmoid colon. The polyp was pedunculated.       The polyp was removed with a cold snare. Resection and retrieval were       complete.      Two sessile polyps were found in the ascending colon. The polyps were 3       to 4 mm in size. These polyps were removed with a cold biopsy forceps.       Resection and retrieval were complete.      Three sessile polyps were found in the descending colon. The polyps were       4 to 7 mm in size. These polyps were removed with a cold snare.       Resection and retrieval were complete.      Non-bleeding internal hemorrhoids were found during retroflexion. The       hemorrhoids were Grade II (internal hemorrhoids that prolapse but reduce       spontaneously).      Multiple small-mouthed diverticula were found in the sigmoid colon. Impression:            - One 3 mm polyp in the sigmoid colon, removed with a                         cold biopsy forceps. Resected and retrieved.                        - One 7 mm polyp in the sigmoid colon, removed with a  cold snare. Resected and retrieved.                        - Two 3 to 4 mm polyps in the ascending colon, removed                         with a cold biopsy forceps. Resected and retrieved.                        -  Three 4 to 7 mm polyps in the descending colon,                         removed with a cold snare. Resected and retrieved.                        - Non-bleeding internal hemorrhoids.                        - Diverticulosis in the sigmoid colon. Recommendation:        - Discharge patient to home.                        - Resume previous diet.                        - Continue present medications.                        - Await pathology results.                        - Repeat colonoscopy in 3 years for surveillance. Procedure Code(s):     --- Professional ---                        [phone], Colonoscopy, flexible; with removal of                         tumor(s), polyp(s), or other lesion(s) by snare                         technique                        [id number], 31, Colonoscopy, flexible; with biopsy, single                         or multiple Diagnosis Code(s):     --- Professional ---                        Z86.010, Personal history of colonic polyps                        K63.5, Polyp of colon CPT copyright 2019 American Medical Association. All rights reserved. The codes documented in this report are preliminary and upon coder review may  be revised to meet current compliance requirements. [name] MD, MD 05/13/2021 9:43:31 AM This report has been signed electronically. Number of Addenda: 0 Note Initiated On: 05/13/2021 8:52 AM Scope Withdrawal Time: 0 hours 16 minutes 58 seconds  Total Procedure  Duration: 0 hours 20 minutes 54 seconds  Estimated Blood Loss:  Estimated blood loss: none.      [facility]

## 2021-05-13 NOTE — Transfer of Care (Signed)
Immediate Anesthesia Transfer of Care Note  Patient: Daryl Cochran  Procedure(s) Performed: COLONOSCOPY WITH PROPOFOL (Rectum) POLYPECTOMY (Rectum)  Patient Location: PACU  Anesthesia Type: General  Level of Consciousness: awake, alert  and patient cooperative  Airway and Oxygen Therapy: Patient Spontanous Breathing and Patient connected to supplemental oxygen  Post-op Assessment: Post-op Vital signs reviewed, Patient's Cardiovascular Status Stable, Respiratory Function Stable, Patent Airway and No signs of Nausea or vomiting  Post-op Vital Signs: Reviewed and stable  Complications: No notable events documented.

## 2021-05-13 NOTE — Anesthesia Preprocedure Evaluation (Signed)
Anesthesia Evaluation  Patient identified by MRN, date of birth, ID band Patient awake    Reviewed: Allergy & Precautions, H&P , NPO status , Patient's Chart, lab work & pertinent test results  Airway Mallampati: II  TM Distance: >3 FB Neck ROM: full    Dental no notable dental hx.    Pulmonary Current Smoker and Patient abstained from smoking.,    Pulmonary exam normal breath sounds clear to auscultation       Cardiovascular hypertension, Normal cardiovascular exam Rhythm:regular Rate:Normal     Neuro/Psych    GI/Hepatic   Endo/Other  diabetes  Renal/GU      Musculoskeletal   Abdominal   Peds  Hematology   Anesthesia Other Findings   Reproductive/Obstetrics                             Anesthesia Physical Anesthesia Plan  ASA: 2  Anesthesia Plan: General   Post-op Pain Management: Minimal or no pain anticipated   Induction: Intravenous  PONV Risk Score and Plan: 2 and Treatment may vary due to age or medical condition, TIVA and Propofol infusion  Airway Management Planned: Natural Airway  Additional Equipment:   Intra-op Plan:   Post-operative Plan:   Informed Consent: I have reviewed the patients History and Physical, chart, labs and discussed the procedure including the risks, benefits and alternatives for the proposed anesthesia with the patient or authorized representative who has indicated his/her understanding and acceptance.     Dental Advisory Given  Plan Discussed with: CRNA  Anesthesia Plan Comments:         Anesthesia Quick Evaluation

## 2021-05-13 NOTE — Anesthesia Postprocedure Evaluation (Signed)
Anesthesia Post Note  Patient: Daryl Cochran  Procedure(s) Performed: COLONOSCOPY WITH PROPOFOL (Rectum) POLYPECTOMY (Rectum)     Patient location during evaluation: PACU Anesthesia Type: General Level of consciousness: awake and alert and oriented Pain management: satisfactory to patient Vital Signs Assessment: post-procedure vital signs reviewed and stable Respiratory status: spontaneous breathing, nonlabored ventilation and respiratory function stable Cardiovascular status: blood pressure returned to baseline and stable Postop Assessment: Adequate PO intake and No signs of nausea or vomiting Anesthetic complications: no   No notable events documented.  Raliegh Ip

## 2021-05-13 NOTE — Anesthesia Procedure Notes (Signed)
Procedure Name: MAC Date/Time: 05/13/2021 9:17 AM Performed by: Georga Bora, CRNA Pre-anesthesia Checklist: Patient identified, Emergency Drugs available, Suction available, Patient being monitored and Timeout performed Patient Re-evaluated:Patient Re-evaluated prior to induction Oxygen Delivery Method: Nasal cannula Induction Type: IV induction Placement Confirmation: positive ETCO2 and breath sounds checked- equal and bilateral

## 2021-05-13 NOTE — H&P (Signed)
[name], MD [facility] [address], [place], [place] [address] Phone:[phone] Fax : [phone]  Primary Care Physician:  Glean Hess, MD Primary Gastroenterologist:  Dr. Allen Norris  Pre-Procedure History & Physical: HPI:  Daryl Cochran is a 61 y.o. male is here for an colonoscopy.   Past Medical History:  Diagnosis Date   Diabetes mellitus without complication ([place])    Essential (primary) hypertension 10/30/2014   Hematuria 11/30/2017   Mood disorder ([place]) 11/30/2017    Past Surgical History:  Procedure Laterality Date   COLONOSCOPY WITH PROPOFOL N/A 01/03/2016   Procedure: COLONOSCOPY WITH PROPOFOL;  Surgeon: [name], MD;  Location: [place];  Service: Endoscopy;  Laterality: N/A;  Diabetic - oral meds   FEMUR FRACTURE SURGERY Left    rod in leg "from hip to knee"   HERNIA REPAIR     as child   [place]   POLYPECTOMY  01/03/2016   Procedure: POLYPECTOMY;  Surgeon: [name], MD;  Location: [place];  Service: Endoscopy;;   TONSILLECTOMY      Prior to Admission medications   Not on File    Allergies as of 04/29/2021 - Review Complete 04/29/2021  Allergen Reaction Noted   Atorvastatin Other (See Comments) 04/29/2021    Family History  Problem Relation Age of Onset   Diabetes Father    Heart failure Father        deceased MI age 28    Social History   Socioeconomic History   Marital status: Married    Spouse name: Not on file   Number of children: Not on file   Years of education: Not on file   Highest education level: Not on file  Occupational History   Not on file  Tobacco Use   Smoking status: Some Days    Types: Cigars   Smokeless tobacco: Never   Tobacco comments:    May have a cigar 1x/month  Vaping Use   Vaping Use: Never used  Substance and Sexual Activity   Alcohol use: Yes    Alcohol/week: 4.0 standard drinks    Types: 4 Standard drinks or equivalent per week   Drug use: Never    Sexual activity: Not on file  Other Topics Concern   Not on file  Social History Narrative   Not on file   Social Determinants of Health   Financial Resource Strain: Not on file  Food Insecurity: Not on file  Transportation Needs: Not on file  Physical Activity: Not on file  Stress: Not on file  Social Connections: Not on file  Intimate Partner Violence: Not on file    Review of Systems: See HPI, otherwise negative ROS  Physical Exam: Ht 6\' 2"  (1.88 m)    Wt 97.5 kg    BMI 27.60 kg/m  General:   Alert,  pleasant and cooperative in NAD Head:  Normocephalic and atraumatic. Neck:  Supple; no masses or thyromegaly. Lungs:  Clear throughout to auscultation.    Heart:  Regular rate and rhythm. Abdomen:  Soft, nontender and nondistended. Normal bowel sounds, without guarding, and without rebound.   Neurologic:  Alert and  oriented x4;  grossly normal neurologically.  Impression/Plan: [name] is here for an colonoscopy to be performed for a history of adenomatous polyps on 2017   Risks, benefits, limitations, and alternatives regarding  colonoscopy have been reviewed with the patient.  Questions have been answered.  All parties agreeable.  [name], MD  05/13/2021, 8:56 AM

## 2021-05-14 ENCOUNTER — Encounter: Payer: Self-pay | Admitting: Gastroenterology

## 2021-05-14 LAB — SURGICAL PATHOLOGY

## 2021-05-16 ENCOUNTER — Encounter: Payer: Self-pay | Admitting: Gastroenterology

## 2021-08-30 ENCOUNTER — Encounter: Payer: Self-pay | Admitting: Internal Medicine

## 2021-08-30 ENCOUNTER — Ambulatory Visit: Payer: Managed Care, Other (non HMO) | Admitting: Internal Medicine

## 2021-08-30 VITALS — BP 124/78 | HR 74 | Ht 74.0 in | Wt 213.0 lb

## 2021-08-30 DIAGNOSIS — R7303 Prediabetes: Secondary | ICD-10-CM

## 2021-08-30 DIAGNOSIS — E785 Hyperlipidemia, unspecified: Secondary | ICD-10-CM

## 2021-08-30 MED ORDER — ROSUVASTATIN CALCIUM 5 MG PO TABS: 5.0000 mg | ORAL_TABLET | Freq: Every day | ORAL | 1 refills | Status: DC

## 2021-08-30 NOTE — Progress Notes (Signed)
Date:  08/30/2021   Name:  Daryl Cochran   DOB:  03/21/1961   MRN:  [id number]   Chief Complaint: Diabetes  Hyperlipidemia Chronicity: has declined medication despite 10 yr risk 21% The problem is uncontrolled. Recent lipid tests were reviewed and are high. Pertinent negatives include no chest pain, myalgias or shortness of breath. He is currently on no antihyperlipidemic treatment.  Diabetes He presents for his follow-up diabetic visit. Diabetes type: prediabetes. His disease course has been improving. Pertinent negatives for hypoglycemia include no headaches, nervousness/anxiousness or tremors. Pertinent negatives for diabetes include no chest pain, no fatigue, no polydipsia and no polyuria. Current diabetic treatment includes diet.   Lab Results  Component Value Date   NA 140 04/29/2021   K 5.1 04/29/2021   CO2 23 04/29/2021   GLUCOSE 131 (H) 04/29/2021   BUN 19 04/29/2021   CREATININE 1.10 04/29/2021   CALCIUM 9.7 04/29/2021   EGFR 77 04/29/2021   GFRNONAA 67 04/26/2020   Lab Results  Component Value Date   CHOL 206 (H) 04/29/2021   HDL 30 (L) 04/29/2021   LDLCALC 148 (H) 04/29/2021   TRIG 152 (H) 04/29/2021   CHOLHDL 6.9 (H) 04/29/2021   Lab Results  Component Value Date   TSH 3.315 11/30/2017   Lab Results  Component Value Date   HGBA1C 6.5 (H) 04/29/2021   Lab Results  Component Value Date   WBC 10.3 04/29/2021   HGB 16.6 04/29/2021   HCT 49.3 04/29/2021   MCV 88 04/29/2021   PLT 301 04/29/2021   Lab Results  Component Value Date   ALT 18 04/29/2021   AST 14 04/29/2021   ALKPHOS 71 04/29/2021   BILITOT 0.3 04/29/2021   No results found for: 25OHVITD2, 25OHVITD3, VD25OH   Review of Systems  Constitutional:  Negative for appetite change, fatigue and unexpected weight change.  Eyes:  Negative for visual disturbance.  Respiratory:  Negative for cough, shortness of breath and wheezing.   Cardiovascular:  Negative for chest pain, palpitations and  leg swelling.  Gastrointestinal:  Negative for abdominal pain and blood in stool.  Endocrine: Negative for polydipsia and polyuria.  Genitourinary:  Negative for dysuria and hematuria.  Musculoskeletal:  Negative for myalgias.  Skin:  Negative for color change and rash.  Neurological:  Negative for tremors, numbness and headaches.  Psychiatric/Behavioral:  Negative for dysphoric mood and sleep disturbance. The patient is not nervous/anxious.    Patient Active Problem List   Diagnosis Date Noted   Polyp of ascending colon    OA (osteoarthritis) of ankle 01/26/2019   Benign prostatic hyperplasia with urinary frequency 11/30/2017   Adenomatous colon polyp    Dyslipidemia 10/30/2014   Prediabetes 10/30/2014    Allergies  Allergen Reactions   Atorvastatin Other (See Comments)    Constipation    Past Surgical History:  Procedure Laterality Date   COLONOSCOPY WITH PROPOFOL N/A 01/03/2016   Procedure: COLONOSCOPY WITH PROPOFOL;  Surgeon: [name], MD;  Location: [place];  Service: Endoscopy;  Laterality: N/A;  Diabetic - oral meds   COLONOSCOPY WITH PROPOFOL N/A 05/13/2021   Procedure: COLONOSCOPY WITH PROPOFOL;  Surgeon: [name], MD;  Location: [place];  Service: Endoscopy;  Laterality: N/A;   FEMUR FRACTURE SURGERY Left    rod in leg "from hip to knee"   HERNIA REPAIR     as child   [place]   POLYPECTOMY  01/03/2016   Procedure: POLYPECTOMY;  Surgeon: [name], MD;  Location: [place];  Service: Endoscopy;;   POLYPECTOMY  05/13/2021   Procedure: POLYPECTOMY;  Surgeon: [name], MD;  Location: [place];  Service: Endoscopy;;   TONSILLECTOMY      Social History   Tobacco Use   Smoking status: Some Days    Types: Cigars   Smokeless tobacco: Never   Tobacco comments:    May have a cigar 1x/month  Vaping Use   Vaping Use: Never used  Substance Use Topics   Alcohol use: Yes    Alcohol/week: 4.0  standard drinks    Types: 4 Standard drinks or equivalent per week   Drug use: Never     Medication list has been reviewed and updated.  No outpatient medications have been marked as taking for the 08/30/21 encounter (Office Visit) with Glean Hess, MD.       08/30/2021    8:31 AM 04/29/2021    8:02 AM 10/25/2020    8:06 AM 04/26/2020   10:55 AM  GAD 7 : Generalized Anxiety Score  Nervous, Anxious, on Edge 0 0 0 0  Control/stop worrying 0 0 0 0  Worry too much - different things 1 0 0 0  Trouble relaxing 0 0 0 0  Restless 0 0 0 0  Easily annoyed or irritable  0 0 0  Afraid - awful might happen 0 0 0 0  Total GAD 7 Score  0 0 0  Anxiety Difficulty Not difficult at all Not difficult at all Not difficult at all        08/30/2021    8:31 AM  Depression screen PHQ 2/9  Decreased Interest 0  Down, Depressed, Hopeless 0  PHQ - 2 Score 0  Altered sleeping 0  Tired, decreased energy 0  Change in appetite 0  Feeling bad or failure about yourself  0  Trouble concentrating 0  Moving slowly or fidgety/restless 0  Suicidal thoughts 0  PHQ-9 Score 0  Difficult doing work/chores Not difficult at all    BP Readings from Last 3 Encounters:  08/30/21 124/78  05/13/21 119/83  04/29/21 102/74    Physical Exam Vitals and nursing note reviewed.  Constitutional:      General: He is not in acute distress.    Appearance: Normal appearance. He is well-developed.  HENT:     Head: Normocephalic and atraumatic.  Cardiovascular:     Rate and Rhythm: Normal rate and regular rhythm.  Pulmonary:     Effort: Pulmonary effort is normal. No respiratory distress.     Breath sounds: No wheezing or rhonchi.  Musculoskeletal:     Cervical back: Normal range of motion.     Right lower leg: No edema.     Left lower leg: No edema.  Lymphadenopathy:     Cervical: No cervical adenopathy.  Skin:    General: Skin is warm and dry.     Capillary Refill: Capillary refill takes less than 2  seconds.     Findings: No rash.  Neurological:     General: No focal deficit present.     Mental Status: He is alert and oriented to person, place, and time.  Psychiatric:        Mood and Affect: Mood normal.        Behavior: Behavior normal.    Wt Readings from Last 3 Encounters:  08/30/21 213 lb (96.6 kg)  05/13/21 211 lb (95.7 kg)  04/29/21 215 lb 12.8 oz (97.9  kg)    BP 124/78   Pulse 74   Ht _0  (1.88 m)   Wt 213 lb (96.6 kg)   SpO2 96%   BMI 27.35 kg/m   Assessment and Plan: 1. Dyslipidemia 10 yr risk is 21% - we discussed the rationale for medication and the likelihood that his lipids are primarily genetic.   He will continue diet changes but begin medications  - Lipid panel - rosuvastatin (CRESTOR) 5 MG tablet; Take 1 tablet (5 mg total) by mouth daily.  Dispense: 90 tablet; Refill: 1  2. Prediabetes Continue low carb/low sugar diet - Hemoglobin A1c   Partially dictated using Editor, commissioning. Any errors are unintentional.  Halina Maidens, MD [facility]  08/30/2021

## 2021-08-31 LAB — LIPID PANEL
Chol/HDL Ratio: 7 ratio — ABNORMAL HIGH (ref 0.0–5.0)
Cholesterol, Total: 189 mg/dL (ref 100–199)
HDL: 27 mg/dL — ABNORMAL LOW (ref >39)
LDL Chol Calc (NIH): 141 mg/dL — ABNORMAL HIGH (ref 0–99)
Triglycerides: 112 mg/dL (ref 0–149)
VLDL Cholesterol Cal: 21 mg/dL (ref 5–40)

## 2021-08-31 LAB — HEMOGLOBIN A1C
Est. average glucose Bld gHb Est-mCnc: 137 mg/dL
Hgb A1c MFr Bld: 6.4 % — ABNORMAL HIGH (ref 4.8–5.6)

## 2022-01-02 ENCOUNTER — Encounter: Payer: Self-pay | Admitting: Internal Medicine

## 2022-01-02 ENCOUNTER — Ambulatory Visit: Payer: Managed Care, Other (non HMO) | Admitting: Internal Medicine

## 2022-01-02 VITALS — BP 124/76 | HR 52 | Ht 74.0 in | Wt 214.0 lb

## 2022-01-02 DIAGNOSIS — R7303 Prediabetes: Secondary | ICD-10-CM | POA: Diagnosis not present

## 2022-01-02 DIAGNOSIS — E785 Hyperlipidemia, unspecified: Secondary | ICD-10-CM | POA: Diagnosis not present

## 2022-01-02 NOTE — Progress Notes (Signed)
  Date:  01/02/2022   Name:  Daryl Cochran   DOB:  01/24/1961   MRN:  [id number]   Chief Complaint: Diabetes and Hyperlipidemia  Diabetes He presents for his follow-up diabetic visit. Diabetes type: prediabetes. His disease course has been stable. Pertinent negatives for hypoglycemia include no headaches or tremors. Pertinent negatives for diabetes include no chest pain, no fatigue, no polydipsia and no polyuria. Current diabetic treatment includes diet. He is compliant with treatment most of the time. He participates in exercise daily. An ACE inhibitor/angiotensin II receptor blocker is not being taken.  Hyperlipidemia This is a chronic problem. Pertinent negatives include no chest pain or shortness of breath. Current antihyperlipidemic treatment includes statins (crestor started last visit). There are no compliance problems.     Lab Results  Component Value Date   NA 140 04/29/2021   K 5.1 04/29/2021   CO2 23 04/29/2021   GLUCOSE 131 (H) 04/29/2021   BUN 19 04/29/2021   CREATININE 1.10 04/29/2021   CALCIUM 9.7 04/29/2021   EGFR 77 04/29/2021   GFRNONAA 67 04/26/2020   Lab Results  Component Value Date   CHOL 189 08/30/2021   HDL 27 (L) 08/30/2021   LDLCALC 141 (H) 08/30/2021   TRIG 112 08/30/2021   CHOLHDL 7.0 (H) 08/30/2021   Lab Results  Component Value Date   TSH 3.315 11/30/2017   Lab Results  Component Value Date   HGBA1C 6.4 (H) 08/30/2021   Lab Results  Component Value Date   WBC 10.3 04/29/2021   HGB 16.6 04/29/2021   HCT 49.3 04/29/2021   MCV 88 04/29/2021   PLT 301 04/29/2021   Lab Results  Component Value Date   ALT 18 04/29/2021   AST 14 04/29/2021   ALKPHOS 71 04/29/2021   BILITOT 0.3 04/29/2021   No results found for: "25OHVITD2", "25OHVITD3", "VD25OH"   Review of Systems  Constitutional:  Negative for appetite change, fatigue and unexpected weight change.  Eyes:  Negative for visual disturbance.  Respiratory:  Negative for cough,  shortness of breath and wheezing.   Cardiovascular:  Negative for chest pain, palpitations and leg swelling.  Gastrointestinal:  Negative for abdominal pain and blood in stool.  Endocrine: Negative for polydipsia and polyuria.  Genitourinary:  Negative for dysuria and hematuria.  Skin:  Negative for color change and rash.  Neurological:  Negative for tremors, numbness and headaches.  Psychiatric/Behavioral:  Negative for dysphoric mood.     Patient Active Problem List   Diagnosis Date Noted   Polyp of ascending colon    OA (osteoarthritis) of ankle 01/26/2019   Benign prostatic hyperplasia with urinary frequency 11/30/2017   Adenomatous colon polyp    Dyslipidemia 10/30/2014   Prediabetes 10/30/2014    Allergies  Allergen Reactions   Atorvastatin Other (See Comments)    Constipation    Past Surgical History:  Procedure Laterality Date   COLONOSCOPY WITH PROPOFOL N/A 01/03/2016   Procedure: COLONOSCOPY WITH PROPOFOL;  Surgeon: Darren Wohl, MD;  Location: [facility];  Service: Endoscopy;  Laterality: N/A;  Diabetic - oral meds   COLONOSCOPY WITH PROPOFOL N/A 05/13/2021   Procedure: COLONOSCOPY WITH PROPOFOL;  Surgeon: Wohl, Darren, MD;  Location: [facility];  Service: Endoscopy;  Laterality: N/A;   FEMUR FRACTURE SURGERY Left    rod in leg "from hip to knee"   HERNIA REPAIR     as child   LUMBAR DISC SURGERY  1995   POLYPECTOMY  01/03/2016   Procedure:   POLYPECTOMY;  Surgeon: [name], MD;  Location: [place];  Service: Endoscopy;;   POLYPECTOMY  05/13/2021   Procedure: POLYPECTOMY;  Surgeon: [name], MD;  Location: [place];  Service: Endoscopy;;   TONSILLECTOMY      Social History   Tobacco Use   Smoking status: Some Days    Types: Cigars   Smokeless tobacco: Never   Tobacco comments:    May have a cigar 1x/month  Vaping Use   Vaping Use: Never used  Substance Use Topics   Alcohol use: Yes    Alcohol/week: 4.0 standard  drinks of alcohol    Types: 4 Standard drinks or equivalent per week   Drug use: Never     Medication list has been reviewed and updated.  Current Meds  Medication Sig   rosuvastatin (CRESTOR) 5 MG tablet Take 1 tablet (5 mg total) by mouth daily.       01/02/2022    9:48 AM 08/30/2021    8:31 AM 04/29/2021    8:02 AM 10/25/2020    8:06 AM  GAD 7 : Generalized Anxiety Score  Nervous, Anxious, on Edge 0 0 0 0  Control/stop worrying 0 0 0 0  Worry too much - different things 0 1 0 0  Trouble relaxing 0 0 0 0  Restless 0 0 0 0  Easily annoyed or irritable 1  0 0  Afraid - awful might happen 0 0 0 0  Total GAD 7 Score 1  0 0  Anxiety Difficulty Not difficult at all Not difficult at all Not difficult at all Not difficult at all       01/02/2022    9:48 AM 08/30/2021    8:31 AM 04/29/2021    8:02 AM  Depression screen PHQ 2/9  Decreased Interest 0 0 0  Down, Depressed, Hopeless 0 0 0  PHQ - 2 Score 0 0 0  Altered sleeping 0 0 0  Tired, decreased energy 0 0 0  Change in appetite 0 0 0  Feeling bad or failure about yourself  0 0 0  Trouble concentrating 0 0 0  Moving slowly or fidgety/restless 0 0 0  Suicidal thoughts 0 0 0  PHQ-9 Score 0 0 0  Difficult doing work/chores Not difficult at all Not difficult at all Not difficult at all    BP Readings from Last 3 Encounters:  01/02/22 130/82  08/30/21 124/78  05/13/21 119/83    Physical Exam Vitals and nursing note reviewed.  Constitutional:      General: He is not in acute distress.    Appearance: He is well-developed.  HENT:     Head: Normocephalic and atraumatic.  Neck:     Vascular: No carotid bruit.  Cardiovascular:     Rate and Rhythm: Normal rate and regular rhythm.     Pulses: Normal pulses.  Pulmonary:     Effort: Pulmonary effort is normal. No respiratory distress.     Breath sounds: No rales.  Chest:     Chest wall: No tenderness.  Musculoskeletal:     Cervical back: Normal range of motion.      Right lower leg: No edema.     Left lower leg: No edema.  Lymphadenopathy:     Cervical: No cervical adenopathy.  Skin:    General: Skin is warm and dry.     Findings: No rash.  Neurological:     Mental Status: He is alert and oriented to person, place, and  time.  Psychiatric:        Mood and Affect: Mood normal.        Behavior: Behavior normal.     Wt Readings from Last 3 Encounters:  01/02/22 214 lb (97.1 kg)  08/30/21 213 lb (96.6 kg)  05/13/21 211 lb (95.7 kg)    BP 130/82   Pulse (!) 52   Ht 6' 2" (1.88 m)   Wt 214 lb (97.1 kg)   SpO2 97%   BMI 27.48 kg/m   Assessment and Plan: 1. Dyslipidemia Tolerating statin therapy with Crestor. Will check labs and continue current dose. - Comprehensive metabolic panel - Lipid panel  2. Prediabetes Continue dietary modifications. - Hemoglobin A1c   Partially dictated using Dragon software. Any errors are unintentional.  Laura Berglund, MD [facility]  01/02/2022      

## 2022-01-03 LAB — COMPREHENSIVE METABOLIC PANEL
ALT: 19 IU/L (ref 0–44)
AST: 14 IU/L (ref 0–40)
Albumin/Globulin Ratio: 2.1 (ref 1.2–2.2)
Albumin: 4.8 g/dL (ref 3.9–4.9)
Alkaline Phosphatase: 70 IU/L (ref 44–121)
BUN/Creatinine Ratio: 17 (ref 10–24)
BUN: 16 mg/dL (ref 8–27)
Bilirubin Total: 0.4 mg/dL (ref 0.0–1.2)
CO2: 25 mmol/L (ref 20–29)
Calcium: 9.7 mg/dL (ref 8.6–10.2)
Chloride: 104 mmol/L (ref 96–106)
Creatinine, Ser: 0.94 mg/dL (ref 0.76–1.27)
Globulin, Total: 2.3 g/dL (ref 1.5–4.5)
Glucose: 98 mg/dL (ref 70–99)
Potassium: 4.9 mmol/L (ref 3.5–5.2)
Sodium: 140 mmol/L (ref 134–144)
Total Protein: 7.1 g/dL (ref 6.0–8.5)
eGFR: 92 mL/min/{1.73_m2} (ref >59)

## 2022-01-03 LAB — HEMOGLOBIN A1C
Est. average glucose Bld gHb Est-mCnc: 137 mg/dL
Hgb A1c MFr Bld: 6.4 % — ABNORMAL HIGH (ref 4.8–5.6)

## 2022-01-03 LAB — LIPID PANEL
Chol/HDL Ratio: 4.7 ratio (ref 0.0–5.0)
Cholesterol, Total: 142 mg/dL (ref 100–199)
HDL: 30 mg/dL — ABNORMAL LOW (ref >39)
LDL Chol Calc (NIH): 89 mg/dL (ref 0–99)
Triglycerides: 130 mg/dL (ref 0–149)
VLDL Cholesterol Cal: 23 mg/dL (ref 5–40)

## 2022-01-18 ENCOUNTER — Other Ambulatory Visit: Payer: Self-pay | Admitting: Internal Medicine

## 2022-01-18 DIAGNOSIS — E785 Hyperlipidemia, unspecified: Secondary | ICD-10-CM

## 2022-01-20 NOTE — Telephone Encounter (Signed)
Requested Prescriptions  Pending Prescriptions Disp Refills  . rosuvastatin (CRESTOR) 5 MG tablet [Pharmacy Med Name: ROSUVASTATIN CALCIUM 5 MG TAB] 90 tablet 1    Sig: TAKE 1 TABLET (5 MG TOTAL) BY MOUTH DAILY.     Cardiovascular:  Antilipid - Statins 2 Failed - 01/18/2022  9:15 PM      Failed - Lipid Panel in normal range within the last 12 months    Cholesterol, Total  Date Value Ref Range Status  01/02/2022 142 100 - 199 mg/dL Final   LDL Chol Calc (NIH)  Date Value Ref Range Status  01/02/2022 89 0 - 99 mg/dL Final   HDL  Date Value Ref Range Status  01/02/2022 30 (L) >39 mg/dL Final   Triglycerides  Date Value Ref Range Status  01/02/2022 130 0 - 149 mg/dL Final         Passed - Cr in normal range and within 360 days    Creatinine, Ser  Date Value Ref Range Status  01/02/2022 0.94 0.76 - 1.27 mg/dL Final         Passed - Patient is not pregnant      Passed - Valid encounter within last 12 months    Recent Outpatient Visits          2 weeks ago Dyslipidemia   [place] [facility] at [facility], Jesse Sans, MD   4 months ago Dyslipidemia   [place] [facility] at [facility], Jesse Sans, MD   8 months ago Annual physical exam   [facility] at [facility], Jesse Sans, MD   1 year ago Dyslipidemia   [facility] at [facility], Jesse Sans, MD   1 year ago Annual physical exam   [facility] at [facility], Jesse Sans, MD      Future Appointments            In 3 months [facility], Jesse Sans, MD [place] [facility] at [facility], [facility]

## 2022-04-30 ENCOUNTER — Encounter: Payer: Managed Care, Other (non HMO) | Admitting: Internal Medicine

## 2022-05-12 ENCOUNTER — Ambulatory Visit: Payer: Managed Care, Other (non HMO) | Admitting: Internal Medicine

## 2022-05-12 ENCOUNTER — Encounter: Payer: Self-pay | Admitting: Internal Medicine

## 2022-05-12 VITALS — BP 122/74 | HR 84 | Ht 74.0 in | Wt 218.0 lb

## 2022-05-12 DIAGNOSIS — L723 Sebaceous cyst: Secondary | ICD-10-CM | POA: Diagnosis not present

## 2022-05-12 DIAGNOSIS — L918 Other hypertrophic disorders of the skin: Secondary | ICD-10-CM

## 2022-05-12 NOTE — Progress Notes (Signed)
Date:  05/12/2022   Name:  Daryl Cochran   DOB:  [birth year]   MRN:  [id number]   Chief Complaint: Cyst (Between scrotum and thigh. Found today. Not painful. Nothing coming out of it. )  HPI Scrotal mass - noticed small non tender mass this am.  No bleeding, tried to express material with no effect.  Skin tag - has large skin tag on back that gets caught on clothes and irritated.  Keratosis - large lesion on scalp has never been evaluated.   Lab Results  Component Value Date   NA 140 01/02/2022   K 4.9 01/02/2022   CO2 25 01/02/2022   GLUCOSE 98 01/02/2022   BUN 16 01/02/2022   CREATININE 0.94 01/02/2022   CALCIUM 9.7 01/02/2022   EGFR 92 01/02/2022   GFRNONAA 67 04/26/2020   Lab Results  Component Value Date   CHOL 142 01/02/2022   HDL 30 (L) 01/02/2022   LDLCALC 89 01/02/2022   TRIG 130 01/02/2022   CHOLHDL 4.7 01/02/2022   Lab Results  Component Value Date   TSH 3.315 11/30/2017   Lab Results  Component Value Date   HGBA1C 6.4 (H) 01/02/2022   Lab Results  Component Value Date   WBC 10.3 04/29/2021   HGB 16.6 04/29/2021   HCT 49.3 04/29/2021   MCV 88 04/29/2021   PLT 301 04/29/2021   Lab Results  Component Value Date   ALT 19 01/02/2022   AST 14 01/02/2022   ALKPHOS 70 01/02/2022   BILITOT 0.4 01/02/2022   No results found for: "25OHVITD2", "25OHVITD3", "VD25OH"   Review of Systems  Patient Active Problem List   Diagnosis Date Noted   Polyp of ascending colon    OA (osteoarthritis) of ankle 01/26/2019   Benign prostatic hyperplasia with urinary frequency 11/30/2017   Adenomatous colon polyp    Dyslipidemia 10/30/2014   Prediabetes 10/30/2014    Allergies  Allergen Reactions   Atorvastatin Other (See Comments)    Constipation    Past Surgical History:  Procedure Laterality Date   COLONOSCOPY WITH PROPOFOL N/A 01/03/2016   Procedure: COLONOSCOPY WITH PROPOFOL;  Surgeon: [name], MD;  Location: [place];  Service:  Endoscopy;  Laterality: N/A;  Diabetic - oral meds   COLONOSCOPY WITH PROPOFOL N/A 05/13/2021   Procedure: COLONOSCOPY WITH PROPOFOL;  Surgeon: [name], MD;  Location: [place];  Service: Endoscopy;  Laterality: N/A;   FEMUR FRACTURE SURGERY Left    rod in leg "from hip to knee"   HERNIA REPAIR     as child   [place]   POLYPECTOMY  01/03/2016   Procedure: POLYPECTOMY;  Surgeon: [name], MD;  Location: [place];  Service: Endoscopy;;   POLYPECTOMY  05/13/2021   Procedure: POLYPECTOMY;  Surgeon: [name], MD;  Location: [place];  Service: Endoscopy;;   TONSILLECTOMY      Social History   Tobacco Use   Smoking status: Some Days    Types: Cigars   Smokeless tobacco: Never   Tobacco comments:    May have a cigar 1x/month  Vaping Use   Vaping Use: Never used  Substance Use Topics   Alcohol use: Yes    Alcohol/week: 4.0 standard drinks of alcohol    Types: 4 Standard drinks or equivalent per week   Drug use: Never     Medication list has been reviewed and updated.  Current Meds  Medication Sig   rosuvastatin (  CRESTOR) 5 MG tablet TAKE 1 TABLET (5 MG TOTAL) BY MOUTH DAILY.       05/12/2022    4:31 PM 01/02/2022    9:48 AM 08/30/2021    8:31 AM 04/29/2021    8:02 AM  GAD 7 : Generalized Anxiety Score  Nervous, Anxious, on Edge 0 0 0 0  Control/stop worrying 0 0 0 0  Worry too much - different things 0 0 1 0  Trouble relaxing 0 0 0 0  Restless 0 0 0 0  Easily annoyed or irritable 1 1  0  Afraid - awful might happen 0 0 0 0  Total GAD 7 Score 1 1  0  Anxiety Difficulty Not difficult at all Not difficult at all Not difficult at all Not difficult at all       05/12/2022    4:31 PM 01/02/2022    9:48 AM 08/30/2021    8:31 AM  Depression screen PHQ 2/9  Decreased Interest 0 0 0  Down, Depressed, Hopeless 0 0 0  PHQ - 2 Score 0 0 0  Altered sleeping 0 0 0  Tired, decreased energy 1 0 0  Change in appetite 0 0  0  Feeling bad or failure about yourself  0 0 0  Trouble concentrating 0 0 0  Moving slowly or fidgety/restless 0 0 0  Suicidal thoughts 0 0 0  PHQ-9 Score 1 0 0  Difficult doing work/chores Not difficult at all Not difficult at all Not difficult at all    BP Readings from Last 3 Encounters:  05/12/22 122/74  01/02/22 124/76  08/30/21 124/78    Physical Exam Vitals and nursing note reviewed.  Constitutional:      General: He is not in acute distress.    Appearance: He is well-developed.  HENT:     Head: Normocephalic and atraumatic.  Pulmonary:     Effort: Pulmonary effort is normal. No respiratory distress.  Genitourinary:      Comments: Small pustule on lateral right scrotum with white head.  Non tender, no drainage - approx size of a small green pea Skin:    General: Skin is warm and dry.     Findings: No rash.          Comments: 1 cm pedunculated skin tag  Large irregular raised hyperpigmented lesion 2x2 cm left temporal area  Neurological:     Mental Status: He is alert and oriented to person, place, and time.  Psychiatric:        Mood and Affect: Mood normal.        Behavior: Behavior normal.     Wt Readings from Last 3 Encounters:  05/12/22 218 lb (98.9 kg)  01/02/22 214 lb (97.1 kg)  08/30/21 213 lb (96.6 kg)    BP 122/74   Pulse 84   Ht '6\' 2"'$  (1.88 m)   Wt 218 lb (98.9 kg)   SpO2 94%   BMI 27.99 kg/m   Assessment and Plan: Problem List Items Addressed This Visit   None Visit Diagnoses     Scrotal sebaceous cyst    -  Primary   tiny cyst - recommend hot bath to promote drainage avoid manipulation - should resolve on its own   Skin tag       large skin tag and scalp lesion recommend Dermatology evaluation   Relevant Orders   Ambulatory referral to Dermatology        Partially dictated using Dragon software. Any errors  are unintentional.  Halina Maidens, MD [facility]  05/12/2022

## 2022-05-12 NOTE — Patient Instructions (Signed)
Call [facility] Dermatology here in [facility] for an appointment.

## 2022-09-22 ENCOUNTER — Telehealth: Payer: Self-pay | Admitting: Internal Medicine

## 2022-09-22 NOTE — Telephone Encounter (Signed)
Please review.  KP

## 2022-09-22 NOTE — Telephone Encounter (Signed)
Copied from CRM [id number]. Topic: Referral - Request for Referral >> Sep 22, 2022  2:50 PM Everette C wrote: Has patient seen PCP for this complaint? Yes.   *If NO, is insurance requiring patient see PCP for this issue before PCP can refer them? Referral for which specialty: Dermatology  Preferred provider/office: Patient has no preference  Reason for referral: mole removal / skin tag concern

## 2023-04-03 ENCOUNTER — Ambulatory Visit: Payer: Self-pay | Admitting: *Deleted

## 2023-04-03 ENCOUNTER — Ambulatory Visit: Payer: Managed Care, Other (non HMO) | Admitting: Internal Medicine

## 2023-04-03 ENCOUNTER — Encounter: Payer: Self-pay | Admitting: Internal Medicine

## 2023-04-03 VITALS — BP 120/80 | HR 82 | Ht 74.0 in | Wt 218.0 lb

## 2023-04-03 DIAGNOSIS — N529 Male erectile dysfunction, unspecified: Secondary | ICD-10-CM | POA: Insufficient documentation

## 2023-04-03 DIAGNOSIS — N3281 Overactive bladder: Secondary | ICD-10-CM | POA: Diagnosis not present

## 2023-04-03 DIAGNOSIS — R319 Hematuria, unspecified: Secondary | ICD-10-CM | POA: Insufficient documentation

## 2023-04-03 LAB — POCT URINALYSIS DIPSTICK
Bilirubin, UA: NEGATIVE
Glucose, UA: NEGATIVE
Ketones, UA: POSITIVE
Leukocytes, UA: NEGATIVE
Nitrite, UA: NEGATIVE
Protein, UA: POSITIVE — AB
Spec Grav, UA: 1.02 (ref 1.010–1.025)
Urobilinogen, UA: 0.2 U/dL
pH, UA: 5 (ref 5.0–8.0)

## 2023-04-03 MED ORDER — OXYBUTYNIN CHLORIDE ER 5 MG PO TB24: 5.0000 mg | ORAL_TABLET | Freq: Every day | ORAL | 2 refills | Status: DC

## 2023-04-03 MED ORDER — SILDENAFIL CITRATE 20 MG PO TABS: 20.0000 mg | ORAL_TABLET | Freq: Every day | ORAL | 0 refills | Status: AC | PRN

## 2023-04-03 NOTE — Telephone Encounter (Signed)
Attempted to return call but voice mail is full.    Forwarded information to practice for Dr. Judithann Graves for her information for his appt today at 3:20.

## 2023-04-03 NOTE — Telephone Encounter (Signed)
Message from [place] L sent at 04/03/2023  9:36 AM EST  Summary: Blood in urine   Wife called on behalf of patient, patient woke up this morning to use the bathroom. Patient did not make it to bathroom and noticed blood in urine today. Wife states pt has not been making it to the bathroom and this has been happening for a year now. Pt is scheduled to see Dr. Judithann Graves today at 3pm.          Call History  Contact Date/Time Type Contact Phone/Fax By  04/03/2023 09:33 AM EST Phone (Incoming) Hanes,Rebecca D (Emergency Contact) [phone] Judie Petit) Salley Hews, Dannielle Burn

## 2023-04-03 NOTE — Progress Notes (Signed)
Date:  04/03/2023   Name:  Daryl Cochran   DOB:  17-Feb-1961   MRN:  [id number]   Chief Complaint: Hematuria (Pt in clinic today c/o blood in urine this morning and he is having a hard time holding his urine especially in the mornings. Pt also states he is having ED issues.)  Hematuria This is a new problem. The current episode started yesterday. The problem has been gradually improving since onset. He describes the hematuria as gross hematuria. The hematuria occurs during the initial portion of his urinary stream. He reports no clotting in his urine stream. He is experiencing no pain. He describes his urine color as clear. Irritative symptoms include urgency. Irritative symptoms do not include nocturia. Obstructive symptoms include dribbling. Obstructive symptoms do not include incomplete emptying. Pertinent negatives include no chills, fever, hesitancy or inability to urinate. He is sexually active.  Erectile Dysfunction This is a chronic problem. The current episode started more than 1 year ago. The problem is unchanged. The nature of his difficulty is maintaining erection. He reports his erection duration to be 1 to 5 minutes. Irritative symptoms include urgency. Irritative symptoms do not include nocturia. Obstructive symptoms include dribbling. Obstructive symptoms do not include incomplete emptying. Associated symptoms include hematuria. Pertinent negatives include no chills, hesitancy or inability to urinate.    Review of Systems  Constitutional:  Negative for chills, fatigue and fever.  Respiratory:  Negative for chest tightness and shortness of breath.   Cardiovascular:  Negative for chest pain.  Genitourinary:  Positive for hematuria and urgency. Negative for hesitancy, incomplete emptying and nocturia.     Lab Results  Component Value Date   NA 140 01/02/2022   K 4.9 01/02/2022   CO2 25 01/02/2022   GLUCOSE 98 01/02/2022   BUN 16 01/02/2022   CREATININE 0.94 01/02/2022    CALCIUM 9.7 01/02/2022   EGFR 92 01/02/2022   GFRNONAA 67 04/26/2020   Lab Results  Component Value Date   CHOL 142 01/02/2022   HDL 30 (L) 01/02/2022   LDLCALC 89 01/02/2022   TRIG 130 01/02/2022   CHOLHDL 4.7 01/02/2022   Lab Results  Component Value Date   TSH 3.315 11/30/2017   Lab Results  Component Value Date   HGBA1C 6.4 (H) 01/02/2022   Lab Results  Component Value Date   WBC 10.3 04/29/2021   HGB 16.6 04/29/2021   HCT 49.3 04/29/2021   MCV 88 04/29/2021   PLT 301 04/29/2021   Lab Results  Component Value Date   ALT 19 01/02/2022   AST 14 01/02/2022   ALKPHOS 70 01/02/2022   BILITOT 0.4 01/02/2022   No results found for: "25OHVITD2", "25OHVITD3", "VD25OH"   Patient Active Problem List   Diagnosis Date Noted   OAB (overactive bladder) 04/03/2023   Erectile dysfunction 04/03/2023   Hematuria 04/03/2023   Polyp of ascending colon    OA (osteoarthritis) of ankle 01/26/2019   Benign prostatic hyperplasia with urinary frequency 11/30/2017   Adenomatous colon polyp    Dyslipidemia 10/30/2014   Prediabetes 10/30/2014    Allergies  Allergen Reactions   Atorvastatin Other (See Comments)    Constipation    Past Surgical History:  Procedure Laterality Date   COLONOSCOPY WITH PROPOFOL N/A 01/03/2016   Procedure: COLONOSCOPY WITH PROPOFOL;  Surgeon: Midge Minium, MD;  Location: [facility];  Service: Endoscopy;  Laterality: N/A;  Diabetic - oral meds   COLONOSCOPY WITH PROPOFOL N/A 05/13/2021   Procedure: COLONOSCOPY WITH  PROPOFOL;  Surgeon: Midge Minium, MD;  Location: [facility];  Service: Endoscopy;  Laterality: N/A;   FEMUR FRACTURE SURGERY Left    rod in leg "from hip to knee"   HERNIA REPAIR     as child   LUMBAR DISC SURGERY  1995   POLYPECTOMY  01/03/2016   Procedure: POLYPECTOMY;  Surgeon: Midge Minium, MD;  Location: [facility];  Service: Endoscopy;;   POLYPECTOMY  05/13/2021   Procedure: POLYPECTOMY;  Surgeon: Midge Minium, MD;  Location: [facility];  Service: Endoscopy;;   TONSILLECTOMY      Social History   Tobacco Use   Smoking status: Former    Types: Cigars   Smokeless tobacco: Never   Tobacco comments:    May have a cigar 1x/month  Vaping Use   Vaping status: Never Used  Substance Use Topics   Alcohol use: Yes    Alcohol/week: 4.0 standard drinks of alcohol    Types: 4 Standard drinks or equivalent per week   Drug use: Yes    Types: Marijuana     Medication list has been reviewed and updated.  Current Meds  Medication Sig   Aspirin-Salicylamide-Caffeine (BC HEADACHE POWDER PO) Take 1 Package by mouth 2 (two) times daily as needed.   oxybutynin (DITROPAN XL) 5 MG 24 hr tablet Take 1 tablet (5 mg total) by mouth at bedtime.   sildenafil (REVATIO) 20 MG tablet Take 1 tablet (20 mg total) by mouth daily as needed.       04/03/2023    3:10 PM 05/12/2022    4:31 PM 01/02/2022    9:48 AM 08/30/2021    8:31 AM  GAD 7 : Generalized Anxiety Score  Nervous, Anxious, on Edge 0 0 0 0  Control/stop worrying 0 0 0 0  Worry too much - different things 0 0 0 1  Trouble relaxing 0 0 0 0  Restless 0 0 0 0  Easily annoyed or irritable 0 1 1   Afraid - awful might happen 0 0 0 0  Total GAD 7 Score 0 1 1   Anxiety Difficulty Not difficult at all Not difficult at all Not difficult at all Not difficult at all       04/03/2023    3:10 PM 05/12/2022    4:31 PM 01/02/2022    9:48 AM  Depression screen PHQ 2/9  Decreased Interest 0 0 0  Down, Depressed, Hopeless 0 0 0  PHQ - 2 Score 0 0 0  Altered sleeping 0 0 0  Tired, decreased energy 0 1 0  Change in appetite 0 0 0  Feeling bad or failure about yourself  0 0 0  Trouble concentrating 0 0 0  Moving slowly or fidgety/restless 0 0 0  Suicidal thoughts 0 0 0  PHQ-9 Score 0 1 0  Difficult doing work/chores Not difficult at all Not difficult at all Not difficult at all    BP Readings from Last 3 Encounters:  04/03/23 120/80   05/12/22 122/74  01/02/22 124/76    Physical Exam Vitals and nursing note reviewed.  Constitutional:      General: He is not in acute distress.    Appearance: Normal appearance. He is well-developed.  HENT:     Head: Normocephalic and atraumatic.  Cardiovascular:     Rate and Rhythm: Normal rate and regular rhythm.  Pulmonary:     Effort: Pulmonary effort is normal. No respiratory distress.     Breath sounds:  No wheezing or rhonchi.  Abdominal:     Palpations: Abdomen is soft.     Tenderness: There is no abdominal tenderness.  Skin:    General: Skin is warm and dry.     Findings: No rash.  Neurological:     Mental Status: He is alert and oriented to person, place, and time.  Psychiatric:        Mood and Affect: Mood normal.        Behavior: Behavior normal.    Lab Results  Component Value Date   COLORU yellow 04/03/2023   CLARITYU clear 04/03/2023   GLUCOSEUR Negative 04/03/2023   BILIRUBINUR neg 04/03/2023   KETONESU pos 04/03/2023   SPECGRAV 1.020 04/03/2023   RBCUR moderate 04/03/2023   PHUR 5.0 04/03/2023   PROTEINUR Positive (A) 04/03/2023   UROBILINOGEN 0.2 04/03/2023   LEUKOCYTESUR Negative 04/03/2023     Wt Readings from Last 3 Encounters:  05/12/22 218 lb (98.9 kg)  01/02/22 214 lb (97.1 kg)  08/30/21 213 lb (96.6 kg)    BP 120/80   Pulse 82   SpO2 94%   Assessment and Plan:  Problem List Items Addressed This Visit       Unprioritized   OAB (overactive bladder)   Relevant Medications   oxybutynin (DITROPAN XL) 5 MG 24 hr tablet   Erectile dysfunction   Relevant Medications   sildenafil (REVATIO) 20 MG tablet   Hematuria - Primary   Relevant Orders   Urinalysis, Routine w reflex microscopic   POCT urinalysis dipstick    Return in about 2 months (around 06/04/2023) for OAB.    Reubin Milan, MD [facility] Primary Care and Sports Medicine [place]

## 2023-04-03 NOTE — Telephone Encounter (Addendum)
Attempted to return call to wife, Daryl Cochran (On DPR).   This pt has an appt today at 3:00 with Dr. Judithann Graves for this issue.   Chronic issue for over a year.   Voicemail is full.   Unable to leave a message.

## 2023-04-04 LAB — URINALYSIS, ROUTINE W REFLEX MICROSCOPIC
Bilirubin, UA: NEGATIVE
Glucose, UA: NEGATIVE
Ketones, UA: NEGATIVE
Leukocytes,UA: NEGATIVE
Nitrite, UA: NEGATIVE
Protein,UA: NEGATIVE
RBC, UA: NEGATIVE
Specific Gravity, UA: 1.024 (ref 1.005–1.030)
Urobilinogen, Ur: 0.2 mg/dL (ref 0.2–1.0)
pH, UA: 5.5 (ref 5.0–7.5)

## 2023-04-27 ENCOUNTER — Other Ambulatory Visit: Payer: Self-pay | Admitting: Internal Medicine

## 2023-04-27 DIAGNOSIS — N529 Male erectile dysfunction, unspecified: Secondary | ICD-10-CM

## 2023-04-28 NOTE — Telephone Encounter (Signed)
 Requested medication (s) are due for refill today:   Yes  Requested medication (s) are on the active medication list:   Yes  Future visit scheduled:   Yes 06/08/2023   Last ordered: 04/03/2023 #30, 0 refills   Returned because a 90 day supply along with an RX code are being requested.    Also labs are due per protocol.   (There is a note he is wanting to pay cash for this with GoodRx).      Requested Prescriptions  Pending Prescriptions Disp Refills   sildenafil  (REVATIO ) 20 MG tablet [Pharmacy Med Name: SILDENAFIL  20 MG TABLET] 90 tablet 1    Sig: TAKE 1 TABLET BY MOUTH EVERY DAY AS NEEDED     Urology: Erectile Dysfunction Agents Failed - 04/28/2023  2:06 PM      Failed - AST in normal range and within 360 days    AST  Date Value Ref Range Status  01/02/2022 14 0 - 40 IU/L Final         Failed - ALT in normal range and within 360 days    ALT  Date Value Ref Range Status  01/02/2022 19 0 - 44 IU/L Final         Passed - Last BP in normal range    BP Readings from Last 1 Encounters:  04/03/23 120/80         Passed - Valid encounter within last 12 months    Recent Outpatient Visits           3 weeks ago Hematuria, unspecified type   [facility] Primary Care & Sports Medicine at [facility], Leita DEL, MD   11 months ago Scrotal sebaceous cyst   [place] [facility] Medicine at [facility], Leita DEL, MD   1 year ago Dyslipidemia   [facility] Medicine at [facility], Leita DEL, MD   1 year ago Dyslipidemia   [facility] Primary Care & Sports Medicine at [facility], Leita DEL, MD   1 year ago Annual physical exam   [facility] at [facility], Leita DEL, MD       Future Appointments             In 1 month Justus, Leita DEL, MD [facility] Primary Care & Sports Medicine at [facility], [facility]

## 2023-06-08 ENCOUNTER — Encounter: Payer: Self-pay | Admitting: Internal Medicine

## 2023-06-08 ENCOUNTER — Ambulatory Visit: Payer: Managed Care, Other (non HMO) | Admitting: Internal Medicine

## 2023-06-08 VITALS — BP 112/64 | HR 71 | Ht 74.0 in | Wt 214.0 lb

## 2023-06-08 DIAGNOSIS — D485 Neoplasm of uncertain behavior of skin: Secondary | ICD-10-CM | POA: Insufficient documentation

## 2023-06-08 DIAGNOSIS — N3281 Overactive bladder: Secondary | ICD-10-CM | POA: Diagnosis not present

## 2023-06-08 DIAGNOSIS — Z125 Encounter for screening for malignant neoplasm of prostate: Secondary | ICD-10-CM

## 2023-06-08 DIAGNOSIS — Z13220 Encounter for screening for lipoid disorders: Secondary | ICD-10-CM | POA: Diagnosis not present

## 2023-06-08 DIAGNOSIS — R7303 Prediabetes: Secondary | ICD-10-CM | POA: Diagnosis not present

## 2023-06-08 DIAGNOSIS — Z0000 Encounter for general adult medical examination without abnormal findings: Secondary | ICD-10-CM | POA: Diagnosis not present

## 2023-06-08 NOTE — Progress Notes (Signed)
 Date:  06/08/2023   Name:  Daryl Cochran   DOB:  1960/12/20   MRN:  [id number]   Chief Complaint: Over Active Bladder Daryl Cochran is a 63 y.o. male who presents today for his Complete Annual Exam. He feels well. He reports exercising at work. He reports he is sleeping well.   Health Maintenance  Topic Date Due   HIV Screening  Never done   DTaP/Tdap/Td vaccine (1 - Tdap) Never done   Flu Shot  07/13/2023*   Zoster (Shingles) Vaccine (1 of 2) 09/05/2023*   Colon Cancer Screening  05/13/2024   Hepatitis C Screening  Addressed   Pneumococcal Vaccination  Aged Out   HPV Vaccine  Aged Out   COVID-19 Vaccine  Discontinued  *Topic was postponed. The date shown is not the original due date.    Lab Results  Component Value Date   PSA1 2.2 04/29/2021   PSA1 1.5 04/26/2020   PSA1 1.2 01/26/2019   PSA 0.9 10/29/2012    Urinary Frequency  This is a chronic problem. The problem has been unchanged. The patient is experiencing no pain. There has been no fever. Associated symptoms include frequency. Treatments tried: ditropan caused dry mouth.    Review of Systems  Constitutional:  Negative for fatigue and unexpected weight change.  HENT:  Negative for nosebleeds.   Eyes:  Negative for visual disturbance.  Respiratory:  Negative for cough, chest tightness, shortness of breath and wheezing.   Cardiovascular:  Negative for chest pain, palpitations and leg swelling.  Gastrointestinal:  Negative for abdominal pain, constipation and diarrhea.  Genitourinary:  Positive for frequency.  Neurological:  Negative for dizziness, weakness, light-headedness and headaches.     Lab Results  Component Value Date   NA 140 01/02/2022   K 4.9 01/02/2022   CO2 25 01/02/2022   GLUCOSE 98 01/02/2022   BUN 16 01/02/2022   CREATININE 0.94 01/02/2022   CALCIUM 9.7 01/02/2022   EGFR 92 01/02/2022   GFRNONAA 67 04/26/2020   Lab Results  Component Value Date   CHOL 142 01/02/2022   HDL 30 (L)  01/02/2022   LDLCALC 89 01/02/2022   TRIG 130 01/02/2022   CHOLHDL 4.7 01/02/2022   Lab Results  Component Value Date   TSH 3.315 11/30/2017   Lab Results  Component Value Date   HGBA1C 6.4 (H) 01/02/2022   Lab Results  Component Value Date   WBC 10.3 04/29/2021   HGB 16.6 04/29/2021   HCT 49.3 04/29/2021   MCV 88 04/29/2021   PLT 301 04/29/2021   Lab Results  Component Value Date   ALT 19 01/02/2022   AST 14 01/02/2022   ALKPHOS 70 01/02/2022   BILITOT 0.4 01/02/2022   No results found for: "25OHVITD2", "25OHVITD3", "VD25OH"   Patient Active Problem List   Diagnosis Date Noted   Neoplasm of uncertain behavior of skin 06/08/2023   OAB (overactive bladder) 04/03/2023   Erectile dysfunction 04/03/2023   Hematuria 04/03/2023   Polyp of ascending colon    OA (osteoarthritis) of ankle 01/26/2019   Benign prostatic hyperplasia with urinary frequency 11/30/2017   Adenomatous colon polyp    Dyslipidemia 10/30/2014   Prediabetes 10/30/2014    Allergies  Allergen Reactions   Atorvastatin Other (See Comments)    Constipation    Past Surgical History:  Procedure Laterality Date   COLONOSCOPY WITH PROPOFOL N/A 01/03/2016   Procedure: COLONOSCOPY WITH PROPOFOL;  Surgeon: Midge Minium, MD;  Location: [facility];  Service: Endoscopy;  Laterality: N/A;  Diabetic - oral meds   COLONOSCOPY WITH PROPOFOL N/A 05/13/2021   Procedure: COLONOSCOPY WITH PROPOFOL;  Surgeon: Midge Minium, MD;  Location: [facility];  Service: Endoscopy;  Laterality: N/A;   FEMUR FRACTURE SURGERY Left    rod in leg "from hip to knee"   HERNIA REPAIR     as child   LUMBAR DISC SURGERY  1995   POLYPECTOMY  01/03/2016   Procedure: POLYPECTOMY;  Surgeon: Midge Minium, MD;  Location: [facility];  Service: Endoscopy;;   POLYPECTOMY  05/13/2021   Procedure: POLYPECTOMY;  Surgeon: Midge Minium, MD;  Location: [facility];  Service: Endoscopy;;   TONSILLECTOMY      Social  History   Tobacco Use   Smoking status: Never   Smokeless tobacco: Never  Vaping Use   Vaping status: Never Used  Substance Use Topics   Alcohol use: Yes    Alcohol/week: 4.0 standard drinks of alcohol    Types: 4 Standard drinks or equivalent per week   Drug use: Yes    Types: Marijuana     Medication list has been reviewed and updated.  Current Meds  Medication Sig   sildenafil (REVATIO) 20 MG tablet Take 1 tablet (20 mg total) by mouth daily as needed.   [DISCONTINUED] Aspirin-Salicylamide-Caffeine (BC HEADACHE POWDER PO) Take 1 Package by mouth 2 (two) times daily as needed.   [DISCONTINUED] oxybutynin (DITROPAN XL) 5 MG 24 hr tablet Take 1 tablet (5 mg total) by mouth at bedtime.       06/08/2023    3:04 PM 04/03/2023    3:10 PM 05/12/2022    4:31 PM 01/02/2022    9:48 AM  GAD 7 : Generalized Anxiety Score  Nervous, Anxious, on Edge 0 0 0 0  Control/stop worrying 0 0 0 0  Worry too much - different things 0 0 0 0  Trouble relaxing 0 0 0 0  Restless 0 0 0 0  Easily annoyed or irritable 1 0 1 1  Afraid - awful might happen 0 0 0 0  Total GAD 7 Score 1 0 1 1  Anxiety Difficulty Not difficult at all Not difficult at all Not difficult at all Not difficult at all       06/08/2023    3:04 PM 04/03/2023    3:10 PM 05/12/2022    4:31 PM  Depression screen PHQ 2/9  Decreased Interest 0 0 0  Down, Depressed, Hopeless 0 0 0  PHQ - 2 Score 0 0 0  Altered sleeping  0 0  Tired, decreased energy  0 1  Change in appetite  0 0  Feeling bad or failure about yourself   0 0  Trouble concentrating  0 0  Moving slowly or fidgety/restless  0 0  Suicidal thoughts  0 0  PHQ-9 Score  0 1  Difficult doing work/chores  Not difficult at all Not difficult at all    BP Readings from Last 3 Encounters:  06/08/23 112/64  04/03/23 120/80  05/12/22 122/74    Physical Exam Vitals and nursing note reviewed.  Constitutional:      Appearance: Normal appearance. He is well-developed.   HENT:     Head: Normocephalic.     Right Ear: Tympanic membrane, ear canal and external ear normal.     Left Ear: Tympanic membrane, ear canal and external ear normal.     Nose: Nose normal.  Eyes:     Conjunctiva/sclera:  Conjunctivae normal.     Pupils: Pupils are equal, round, and reactive to light.  Neck:     Thyroid: No thyromegaly.     Vascular: No carotid bruit.  Cardiovascular:     Rate and Rhythm: Normal rate and regular rhythm.     Heart sounds: Normal heart sounds.  Pulmonary:     Effort: Pulmonary effort is normal.     Breath sounds: Normal breath sounds. No wheezing.  Chest:  Breasts:    Right: No mass.     Left: No mass.  Abdominal:     General: Bowel sounds are normal.     Palpations: Abdomen is soft.     Tenderness: There is no abdominal tenderness.  Musculoskeletal:        General: Normal range of motion.     Cervical back: Normal range of motion and neck supple.  Lymphadenopathy:     Cervical: No cervical adenopathy.  Skin:    General: Skin is warm and dry.     Capillary Refill: Capillary refill takes less than 2 seconds.     Findings: Lesion (1x2 cm warty dark lesion on right anterior scalp c/w keratosis) present.  Neurological:     General: No focal deficit present.     Mental Status: He is alert and oriented to person, place, and time.     Deep Tendon Reflexes: Reflexes are normal and symmetric.  Psychiatric:        Attention and Perception: Attention normal.        Mood and Affect: Mood normal.        Thought Content: Thought content normal.     Wt Readings from Last 3 Encounters:  06/08/23 214 lb (97.1 kg)  04/03/23 218 lb (98.9 kg)  05/12/22 218 lb (98.9 kg)    BP 112/64   Pulse 71   Ht 6\' 2"  (1.88 m)   Wt 214 lb (97.1 kg)   SpO2 96%   BMI 27.48 kg/m   Assessment and Plan:  Problem List Items Addressed This Visit       Unprioritized   Prediabetes (Chronic)   Managed with diet only.      Relevant Orders   Comprehensive  metabolic panel   Hemoglobin A1c   OAB (overactive bladder)   Tried medications for about a month but had dry mouth. He has learned that he just needs to head to the rest room when the first urge hits.      Neoplasm of uncertain behavior of skin   Large keratosis on scalp - present since age 18 but gradually enlarging He is not interested in removal recommendations at this time Likelihood of malignancy is low      Other Visit Diagnoses       Annual physical exam    -  Primary   he declines all vaccines. colonoscopy is up to date   Relevant Orders   CBC with Differential/Platelet   Comprehensive metabolic panel   Lipid panel   PSA     Screening for lipid disorders       Relevant Orders   Lipid panel     Prostate cancer screening       Relevant Orders   PSA       No follow-ups on file.    Reubin Milan, MD [facility] Primary Care and Sports Medicine [place]

## 2023-06-08 NOTE — Assessment & Plan Note (Signed)
 Tried medications for about a month but had dry mouth. He has learned that he just needs to head to the rest room when the first urge hits.

## 2023-06-08 NOTE — Assessment & Plan Note (Signed)
Managed with diet only.

## 2023-06-08 NOTE — Assessment & Plan Note (Signed)
 Large keratosis on scalp - present since age 63 but gradually enlarging He is not interested in removal recommendations at this time Likelihood of malignancy is low

## 2023-06-30 ENCOUNTER — Other Ambulatory Visit: Payer: Self-pay | Admitting: Internal Medicine

## 2023-06-30 DIAGNOSIS — N3281 Overactive bladder: Secondary | ICD-10-CM

## 2023-07-01 NOTE — Telephone Encounter (Signed)
 Unable to refill per protocol, Rx expired. Discontinued 06/08/23.  Requested Prescriptions  Pending Prescriptions Disp Refills   oxybutynin (DITROPAN-XL) 5 MG 24 hr tablet [Pharmacy Med Name: OXYBUTYNIN CL ER 5 MG TABLET] 90 tablet     Sig: TAKE 1 TABLET BY MOUTH EVERYDAY AT BEDTIME     Urology:  Bladder Agents Failed - 07/01/2023 10:13 AM      Failed - Valid encounter within last 12 months    Recent Outpatient Visits           2 months ago Hematuria, unspecified type   [place] Primary Care & Sports Medicine at [facility], Nyoka Cowden, MD   1 year ago Scrotal sebaceous cyst   [facility] Primary Care & Sports Medicine at [facility], Nyoka Cowden, MD   1 year ago Dyslipidemia   [facility] Primary Care & Sports Medicine at [facility], Nyoka Cowden, MD   1 year ago Dyslipidemia   [facility] Primary Care & Sports Medicine at [facility], Nyoka Cowden, MD   2 years ago Annual physical exam   [facility] Primary Care & Sports Medicine at [facility], Nyoka Cowden, MD

## 2024-04-12 ENCOUNTER — Telehealth: Payer: Self-pay

## 2024-04-12 NOTE — Telephone Encounter (Unsigned)
 Copied from CRM [id number]. Topic: Appointments - Transfer of Care >> Apr 12, 2024  3:20 PM Antony S wrote: Pt is requesting to transfer FROM: laura berglund Pt is requesting to transfer TO: vinay kotturi Reason for requested transfer: pt request It is the responsibility of the team the patient would like to transfer to (Dr. sol) to reach out to the patient if for any reason this transfer is not acceptable.

## 2024-04-13 NOTE — Telephone Encounter (Signed)
 Noted. Ok with Physical.

## 2024-04-21 ENCOUNTER — Encounter: Payer: Self-pay | Admitting: Family Medicine

## 2024-04-21 ENCOUNTER — Ambulatory Visit (INDEPENDENT_AMBULATORY_CARE_PROVIDER_SITE_OTHER): Admitting: Family Medicine

## 2024-04-21 VITALS — BP 110/70 | HR 69 | Ht 74.0 in | Wt 217.0 lb

## 2024-04-21 DIAGNOSIS — R311 Benign essential microscopic hematuria: Secondary | ICD-10-CM | POA: Diagnosis not present

## 2024-04-21 DIAGNOSIS — R202 Paresthesia of skin: Secondary | ICD-10-CM | POA: Diagnosis not present

## 2024-04-21 DIAGNOSIS — R7303 Prediabetes: Secondary | ICD-10-CM

## 2024-04-21 DIAGNOSIS — E785 Hyperlipidemia, unspecified: Secondary | ICD-10-CM

## 2024-04-21 DIAGNOSIS — Z125 Encounter for screening for malignant neoplasm of prostate: Secondary | ICD-10-CM

## 2024-04-21 LAB — POCT GLYCOSYLATED HEMOGLOBIN (HGB A1C): Hemoglobin A1C: 6.2 % — AB (ref 4.0–5.6)

## 2024-04-21 NOTE — Progress Notes (Signed)
 "  Established Patient Office Visit  Patient ID: Daryl Cochran, male    DOB: 1960/07/18  Age: 64 y.o. MRN: [id number] PCP: Rodneisha Bonnet K, MD  Chief Complaint  Patient presents with   Establish Care   Numbness    In fingertips, comes and goes, x 3 weeks     Subjective:     HPI  Discussed the use of AI scribe software for clinical note transcription with the patient, who gave verbal consent to proceed.  History of Present Illness Daryl Cochran is a 64 year old male with diabetes who presents with numbness in his fingers.  He has been experiencing numbness in a few of his fingers, specifically at the very ends, for the past few weeks. The numbness is not present in all fingers and is not associated with color changes or exposure to cold. His arms go to sleep when he sleeps on his side, particularly affecting his right finger. His wife can alleviate the numbness by rubbing his hands, which he describes as a tingling sensation rather than a loss of feeling.  He is actively involved in remodeling his house, which involves wearing leather gloves and performing manual labor over the weekends. His fingers become sensitive and tired after wearing the gloves and engaging in extensive work.  He has a history of diabetes, previously managed with metformin , but he is currently not on any diabetes medication. He tries to manage his condition through diet by avoiding overeating and limiting sugar intake, although he acknowledges room for improvement.  Family history includes a sister with multiple cancers, a mother who died of COVID-19, and both his father and grandfather had heart disease. His father died of heart failure at a young age.  He does not smoke cigarettes but uses marijuana and does not consume alcohol.  No color changes in fingers. He reports no ulcers, open wounds, or calluses. No numbness or tingling in the nose. No current neck or back issues, though he had back surgery in the past.  No history of blood in the urine detectable by him, though previous urinalysis showed microscopic blood.     Review of Systems  All other systems reviewed and are negative.     Objective:     BP 110/70   Pulse 69   Ht 6' 2 (1.88 m)   Wt 217 lb (98.4 kg)   SpO2 95%   BMI 27.86 kg/m     Physical Exam Vitals and nursing note reviewed.  Constitutional:      Appearance: Normal appearance.  HENT:     Head: Normocephalic.     Right Ear: External ear normal.     Left Ear: External ear normal.  Eyes:     Conjunctiva/sclera: Conjunctivae normal.  Cardiovascular:     Rate and Rhythm: Normal rate.  Pulmonary:     Effort: Pulmonary effort is normal. No respiratory distress.  Abdominal:     Palpations: Abdomen is soft.  Musculoskeletal:        General: Normal range of motion.  Skin:    General: Skin is warm.  Neurological:     Mental Status: He is alert and oriented to person, place, and time.  Psychiatric:        Mood and Affect: Mood normal.     Physical Exam CARDIOVASCULAR: Pulse normal. EXTREMITIES: Nails normal. SKIN: Skin normal.   No results found for any visits on 04/21/24.     The 10-year ASCVD risk score (Arnett DK, et al.,  2019) is: 9%    Assessment & Plan:   Problem List Items Addressed This Visit   None   Assessment and Plan Assessment & Plan Paresthesia of the fingers Intermittent numbness and tingling, possibly related to manual work and glove use. Differential includes neuropathy, diabetes, or hyperlipidemia. - Ordered blood work for thyroid  issues, B12 deficiency, and folate deficiency. - Recommended night splint or wrist brace. - Scheduled follow-up with physical therapy.  Prediabetes Previous A1c of 6.4%. Managed with diet. Potential contributor to finger paresthesia. - Ordered A1c test. - Discussed dietary modifications for glycemic control.  Hyperlipidemia Elevated cholesterol levels, potential contributor to paresthesia and  cardiovascular risk. Family history of heart disease. - Discussed dietary modifications for cholesterol management.  Benign essential microscopic hematuria Previous urinalysis showed microscopic hematuria. - Ordered urinalysis to recheck for hematuria.  General Health Maintenance Routine health maintenance discussed. - Scheduled physical exam for February. - Coordinated lab work prior to physical exam.    No follow-ups on file.    Ken Bonn K Loreen Bankson, MD [facility] at [facility]   "

## 2024-06-07 ENCOUNTER — Encounter: Payer: Self-pay | Admitting: Family Medicine
